# Patient Record
Sex: Female | Born: 1950
Health system: Southern US, Community
[De-identification: ages and names within clinical notes are randomized; demographics above are authoritative.]

## PROBLEM LIST (undated history)

## (undated) ENCOUNTER — Emergency Department (HOSPITAL_BASED_OUTPATIENT_CLINIC_OR_DEPARTMENT_OTHER): Admission: EM | Payer: BC Managed Care – PPO

## (undated) DIAGNOSIS — N959 Unspecified menopausal and perimenopausal disorder: Principal | ICD-10-CM

## (undated) DIAGNOSIS — K5790 Diverticulosis of intestine, part unspecified, without perforation or abscess without bleeding: Secondary | ICD-10-CM

## (undated) DIAGNOSIS — D649 Anemia, unspecified: Secondary | ICD-10-CM

## (undated) DIAGNOSIS — M949 Disorder of cartilage, unspecified: Secondary | ICD-10-CM

## (undated) DIAGNOSIS — T7840XA Allergy, unspecified, initial encounter: Secondary | ICD-10-CM

## (undated) DIAGNOSIS — N200 Calculus of kidney: Secondary | ICD-10-CM

## (undated) DIAGNOSIS — E785 Hyperlipidemia, unspecified: Secondary | ICD-10-CM

## (undated) DIAGNOSIS — K649 Unspecified hemorrhoids: Secondary | ICD-10-CM

## (undated) DIAGNOSIS — M899 Disorder of bone, unspecified: Secondary | ICD-10-CM

## (undated) HISTORY — PX: CYSTOSCOPY: SUR368

## (undated) HISTORY — PX: TONSILLECTOMY: SHX5217

## (undated) HISTORY — DX: Unspecified hemorrhoids: K64.9

## (undated) HISTORY — DX: Disorder of cartilage, unspecified: M94.9

## (undated) HISTORY — PX: POLYPECTOMY: SHX149

## (undated) HISTORY — DX: Disorder of bone, unspecified: M89.9

## (undated) HISTORY — DX: Anemia, unspecified: D64.9

## (undated) HISTORY — DX: Diverticulosis of intestine, part unspecified, without perforation or abscess without bleeding: K57.90

## (undated) HISTORY — DX: Hyperlipidemia, unspecified: E78.5

## (undated) HISTORY — DX: Allergy, unspecified, initial encounter: T78.40XA

## (undated) HISTORY — PX: COLONOSCOPY: SHX174

## (undated) HISTORY — DX: Unspecified menopausal and perimenopausal disorder: N95.9

---

## 1998-04-19 HISTORY — PX: FRACTURE SURGERY: SHX138

## 1998-04-28 ENCOUNTER — Encounter: Payer: Self-pay | Admitting: Family Medicine

## 1998-04-28 ENCOUNTER — Ambulatory Visit (HOSPITAL_COMMUNITY): Admission: RE | Admit: 1998-04-28 | Discharge: 1998-04-28 | Payer: Self-pay | Admitting: Family Medicine

## 1998-04-28 ENCOUNTER — Emergency Department (HOSPITAL_COMMUNITY): Admission: EM | Admit: 1998-04-28 | Discharge: 1998-04-28 | Payer: Self-pay | Admitting: Emergency Medicine

## 1998-04-30 ENCOUNTER — Encounter: Payer: Self-pay | Admitting: Urology

## 1998-04-30 ENCOUNTER — Inpatient Hospital Stay (HOSPITAL_COMMUNITY): Admission: EM | Admit: 1998-04-30 | Discharge: 1998-05-01 | Payer: Self-pay | Admitting: Urology

## 1998-05-01 ENCOUNTER — Encounter: Payer: Self-pay | Admitting: Urology

## 1999-02-24 ENCOUNTER — Other Ambulatory Visit: Admission: RE | Admit: 1999-02-24 | Discharge: 1999-02-24 | Payer: Self-pay | Admitting: Family Medicine

## 2000-11-24 ENCOUNTER — Other Ambulatory Visit: Admission: RE | Admit: 2000-11-24 | Discharge: 2000-11-24 | Payer: Self-pay | Admitting: Family Medicine

## 2001-04-03 ENCOUNTER — Ambulatory Visit (HOSPITAL_COMMUNITY): Admission: RE | Admit: 2001-04-03 | Discharge: 2001-04-03 | Payer: Self-pay | Admitting: Gastroenterology

## 2002-01-16 ENCOUNTER — Ambulatory Visit (HOSPITAL_COMMUNITY): Admission: RE | Admit: 2002-01-16 | Discharge: 2002-01-16 | Payer: Self-pay | Admitting: Orthopedic Surgery

## 2003-03-20 ENCOUNTER — Encounter: Admission: RE | Admit: 2003-03-20 | Discharge: 2003-03-20 | Payer: Self-pay | Admitting: Family Medicine

## 2003-08-22 ENCOUNTER — Other Ambulatory Visit: Admission: RE | Admit: 2003-08-22 | Discharge: 2003-08-22 | Payer: Self-pay | Admitting: Family Medicine

## 2004-02-27 ENCOUNTER — Ambulatory Visit: Payer: Self-pay | Admitting: Family Medicine

## 2004-04-02 ENCOUNTER — Ambulatory Visit: Payer: Self-pay | Admitting: Family Medicine

## 2004-09-15 ENCOUNTER — Ambulatory Visit: Payer: Self-pay | Admitting: Family Medicine

## 2004-09-24 ENCOUNTER — Other Ambulatory Visit: Admission: RE | Admit: 2004-09-24 | Discharge: 2004-09-24 | Payer: Self-pay | Admitting: Family Medicine

## 2004-09-24 ENCOUNTER — Ambulatory Visit: Payer: Self-pay | Admitting: Family Medicine

## 2004-11-25 ENCOUNTER — Ambulatory Visit: Payer: Self-pay | Admitting: Family Medicine

## 2005-03-04 ENCOUNTER — Ambulatory Visit: Payer: Self-pay | Admitting: Family Medicine

## 2005-03-09 ENCOUNTER — Ambulatory Visit: Payer: Self-pay | Admitting: Family Medicine

## 2005-06-01 ENCOUNTER — Ambulatory Visit: Payer: Self-pay | Admitting: Family Medicine

## 2005-09-28 ENCOUNTER — Ambulatory Visit: Payer: Self-pay | Admitting: Family Medicine

## 2005-10-19 ENCOUNTER — Ambulatory Visit: Payer: Self-pay | Admitting: Family Medicine

## 2005-10-26 ENCOUNTER — Ambulatory Visit: Payer: Self-pay | Admitting: Family Medicine

## 2005-10-26 ENCOUNTER — Other Ambulatory Visit: Admission: RE | Admit: 2005-10-26 | Discharge: 2005-10-26 | Payer: Self-pay | Admitting: Family Medicine

## 2005-10-26 ENCOUNTER — Encounter: Payer: Self-pay | Admitting: Family Medicine

## 2005-10-27 ENCOUNTER — Encounter: Payer: Self-pay | Admitting: Family Medicine

## 2005-10-27 LAB — CONVERTED CEMR LAB

## 2006-01-20 ENCOUNTER — Encounter: Admission: RE | Admit: 2006-01-20 | Discharge: 2006-01-20 | Payer: Self-pay | Admitting: Family Medicine

## 2006-03-02 ENCOUNTER — Ambulatory Visit: Payer: Self-pay | Admitting: Family Medicine

## 2006-04-11 ENCOUNTER — Ambulatory Visit: Payer: Self-pay | Admitting: Family Medicine

## 2006-05-19 LAB — HM COLONOSCOPY

## 2006-06-15 ENCOUNTER — Ambulatory Visit: Payer: Self-pay | Admitting: Family Medicine

## 2006-10-26 ENCOUNTER — Ambulatory Visit: Payer: Self-pay | Admitting: Family Medicine

## 2006-10-26 LAB — CONVERTED CEMR LAB
ALT: 24 units/L (ref 0–35)
Albumin: 3.9 g/dL (ref 3.5–5.2)
Alkaline Phosphatase: 69 units/L (ref 39–117)
Basophils Relative: 0 % (ref 0.0–1.0)
Bilirubin, Direct: 0.1 mg/dL (ref 0.0–0.3)
Eosinophils Relative: 2.4 % (ref 0.0–5.0)
HCT: 39.2 % (ref 36.0–46.0)
HDL: 54.3 mg/dL (ref >39.0)
Hemoglobin: 13.6 g/dL (ref 12.0–15.0)
Lymphocytes Relative: 36 % (ref 12.0–46.0)
MCHC: 34.5 g/dL (ref 30.0–36.0)
MCV: 91.8 fL (ref 78.0–100.0)
Monocytes Relative: 12.4 % — ABNORMAL HIGH (ref 3.0–11.0)
Neutrophils Relative %: 49.2 % (ref 43.0–77.0)
Platelets: 299 10*3/uL (ref 150–400)
Potassium: 3.8 meq/L (ref 3.5–5.1)
TSH: 3.46 microintl units/mL (ref 0.35–5.50)
Total Bilirubin: 0.8 mg/dL (ref 0.3–1.2)
Total CHOL/HDL Ratio: 4.2
Total Protein: 7.4 g/dL (ref 6.0–8.3)
Triglycerides: 148 mg/dL (ref 0–149)

## 2006-11-02 ENCOUNTER — Other Ambulatory Visit: Admission: RE | Admit: 2006-11-02 | Discharge: 2006-11-02 | Payer: Self-pay | Admitting: Family Medicine

## 2006-11-02 ENCOUNTER — Ambulatory Visit: Payer: Self-pay | Admitting: Family Medicine

## 2006-11-02 ENCOUNTER — Encounter: Payer: Self-pay | Admitting: Family Medicine

## 2006-11-07 ENCOUNTER — Encounter: Payer: Self-pay | Admitting: Family Medicine

## 2006-11-10 ENCOUNTER — Telehealth: Payer: Self-pay | Admitting: Family Medicine

## 2006-11-16 ENCOUNTER — Telehealth: Payer: Self-pay | Admitting: Family Medicine

## 2006-12-06 ENCOUNTER — Telehealth: Payer: Self-pay | Admitting: Family Medicine

## 2007-02-23 ENCOUNTER — Telehealth: Payer: Self-pay | Admitting: Family Medicine

## 2007-03-15 ENCOUNTER — Encounter: Payer: Self-pay | Admitting: Family Medicine

## 2007-03-15 ENCOUNTER — Encounter: Admission: RE | Admit: 2007-03-15 | Discharge: 2007-03-15 | Payer: Self-pay | Admitting: Family Medicine

## 2007-05-02 ENCOUNTER — Encounter: Payer: Self-pay | Admitting: Family Medicine

## 2007-11-30 ENCOUNTER — Ambulatory Visit: Payer: Self-pay | Admitting: Family Medicine

## 2007-11-30 LAB — CONVERTED CEMR LAB
Bilirubin Urine: NEGATIVE
Glucose, Urine, Semiquant: NEGATIVE
Ketones, urine, test strip: NEGATIVE
Protein, U semiquant: NEGATIVE
Specific Gravity, Urine: 1.02

## 2007-12-07 ENCOUNTER — Ambulatory Visit: Payer: Self-pay | Admitting: Family Medicine

## 2007-12-07 ENCOUNTER — Other Ambulatory Visit: Admission: RE | Admit: 2007-12-07 | Discharge: 2007-12-07 | Payer: Self-pay | Admitting: Family Medicine

## 2007-12-07 ENCOUNTER — Encounter: Payer: Self-pay | Admitting: Family Medicine

## 2007-12-07 DIAGNOSIS — E785 Hyperlipidemia, unspecified: Secondary | ICD-10-CM | POA: Insufficient documentation

## 2007-12-07 DIAGNOSIS — M899 Disorder of bone, unspecified: Secondary | ICD-10-CM

## 2007-12-07 DIAGNOSIS — M949 Disorder of cartilage, unspecified: Secondary | ICD-10-CM

## 2007-12-07 HISTORY — DX: Disorder of bone, unspecified: M89.9

## 2007-12-07 HISTORY — DX: Hyperlipidemia, unspecified: E78.5

## 2007-12-07 LAB — CONVERTED CEMR LAB
BUN: 15 mg/dL (ref 6–23)
Bilirubin, Direct: 0.1 mg/dL (ref 0.0–0.3)
CO2: 29 meq/L (ref 19–32)
Calcium: 9 mg/dL (ref 8.4–10.5)
Chloride: 109 meq/L (ref 96–112)
Cholesterol: 225 mg/dL (ref 0–200)
Direct LDL: 147.6 mg/dL
Eosinophils Absolute: 0.1 10*3/uL (ref 0.0–0.7)
GFR calc non Af Amer: 79 mL/min
Glucose, Bld: 88 mg/dL (ref 70–99)
HCT: 37.9 % (ref 36.0–46.0)
Lymphocytes Relative: 29.3 % (ref 12.0–46.0)
MCHC: 34.6 g/dL (ref 30.0–36.0)
MCV: 92 fL (ref 78.0–100.0)
Monocytes Relative: 10.8 % (ref 3.0–12.0)
Neutro Abs: 3.2 10*3/uL (ref 1.4–7.7)
Neutrophils Relative %: 57.1 % (ref 43.0–77.0)
RDW: 11.6 % (ref 11.5–14.6)
Sodium: 142 meq/L (ref 135–145)
TSH: 2.34 microintl units/mL (ref 0.35–5.50)
Total Bilirubin: 0.7 mg/dL (ref 0.3–1.2)
Total CHOL/HDL Ratio: 4.7
Triglycerides: 135 mg/dL (ref 0–149)

## 2007-12-14 LAB — CONVERTED CEMR LAB: Vit D, 1,25-Dihydroxy: 15 — ABNORMAL LOW (ref 30–89)

## 2007-12-15 LAB — CONVERTED CEMR LAB: Pap Smear: NORMAL

## 2008-03-28 ENCOUNTER — Encounter: Admission: RE | Admit: 2008-03-28 | Discharge: 2008-03-28 | Payer: Self-pay | Admitting: Family Medicine

## 2008-06-05 ENCOUNTER — Telehealth: Payer: Self-pay | Admitting: Family Medicine

## 2008-12-02 ENCOUNTER — Ambulatory Visit: Payer: Self-pay | Admitting: Family Medicine

## 2008-12-10 ENCOUNTER — Encounter: Payer: Self-pay | Admitting: Family Medicine

## 2008-12-10 ENCOUNTER — Other Ambulatory Visit: Admission: RE | Admit: 2008-12-10 | Discharge: 2008-12-10 | Payer: Self-pay | Admitting: Family Medicine

## 2008-12-10 ENCOUNTER — Ambulatory Visit: Payer: Self-pay | Admitting: Family Medicine

## 2008-12-10 DIAGNOSIS — N959 Unspecified menopausal and perimenopausal disorder: Secondary | ICD-10-CM

## 2008-12-10 HISTORY — DX: Unspecified menopausal and perimenopausal disorder: N95.9

## 2008-12-19 ENCOUNTER — Encounter: Payer: Self-pay | Admitting: Family Medicine

## 2008-12-19 LAB — CONVERTED CEMR LAB
AST: 24 units/L (ref 0–37)
Albumin: 3.9 g/dL (ref 3.5–5.2)
Alkaline Phosphatase: 67 units/L (ref 39–117)
Basophils Relative: 0 % (ref 0.0–3.0)
Bilirubin Urine: NEGATIVE
Bilirubin, Direct: 0.1 mg/dL (ref 0.0–0.3)
Calcium: 9.1 mg/dL (ref 8.4–10.5)
Cholesterol: 231 mg/dL — ABNORMAL HIGH (ref 0–200)
GFR calc non Af Amer: 78.58 mL/min (ref >60)
HCT: 39.5 % (ref 36.0–46.0)
Hemoglobin: 13.7 g/dL (ref 12.0–15.0)
Ketones, ur: NEGATIVE mg/dL
MCHC: 34.7 g/dL (ref 30.0–36.0)
MCV: 91.1 fL (ref 78.0–100.0)
Neutro Abs: 1.6 10*3/uL (ref 1.4–7.7)
Neutrophils Relative %: 39.1 % — ABNORMAL LOW (ref 43.0–77.0)
RBC: 4.33 M/uL (ref 3.87–5.11)
Sodium: 143 meq/L (ref 135–145)
TSH: 4.22 microintl units/mL (ref 0.35–5.50)
Total CHOL/HDL Ratio: 5
Total Protein, Urine: NEGATIVE mg/dL
Total Protein: 7.2 g/dL (ref 6.0–8.3)
Urine Glucose: NEGATIVE mg/dL
Urobilinogen, UA: 0.2 (ref 0.0–1.0)
VLDL: 27.4 mg/dL (ref 0.0–40.0)

## 2009-04-07 ENCOUNTER — Encounter: Admission: RE | Admit: 2009-04-07 | Discharge: 2009-04-07 | Payer: Self-pay | Admitting: Family Medicine

## 2009-06-26 ENCOUNTER — Telehealth: Payer: Self-pay | Admitting: Family Medicine

## 2009-09-25 ENCOUNTER — Ambulatory Visit: Payer: Self-pay | Admitting: Family Medicine

## 2009-09-25 DIAGNOSIS — D649 Anemia, unspecified: Secondary | ICD-10-CM | POA: Insufficient documentation

## 2009-09-25 DIAGNOSIS — R197 Diarrhea, unspecified: Secondary | ICD-10-CM | POA: Insufficient documentation

## 2009-09-25 HISTORY — DX: Anemia, unspecified: D64.9

## 2009-10-06 LAB — CONVERTED CEMR LAB: Chloride: 107 meq/L (ref 96–112)

## 2009-11-26 ENCOUNTER — Telehealth: Payer: Self-pay | Admitting: Family Medicine

## 2010-02-19 ENCOUNTER — Ambulatory Visit: Payer: Self-pay | Admitting: Family Medicine

## 2010-02-24 LAB — CONVERTED CEMR LAB
AST: 26 units/L (ref 0–37)
Alkaline Phosphatase: 79 units/L (ref 39–117)
Basophils Absolute: 0.1 10*3/uL (ref 0.0–0.1)
Basophils Relative: 0.8 % (ref 0.0–3.0)
Bilirubin, Direct: 0.1 mg/dL (ref 0.0–0.3)
Calcium: 9.2 mg/dL (ref 8.4–10.5)
Chloride: 103 meq/L (ref 96–112)
Cholesterol: 249 mg/dL — ABNORMAL HIGH (ref 0–200)
Creatinine, Ser: 0.9 mg/dL (ref 0.4–1.2)
Direct LDL: 162 mg/dL
Eosinophils Absolute: 0.1 10*3/uL (ref 0.0–0.7)
Eosinophils Relative: 1.4 % (ref 0.0–5.0)
GFR calc non Af Amer: 71.98 mL/min (ref >60)
Glucose, Bld: 80 mg/dL (ref 70–99)
Hemoglobin: 13.3 g/dL (ref 12.0–15.0)
Lymphocytes Relative: 34.1 % (ref 12.0–46.0)
MCHC: 34.5 g/dL (ref 30.0–36.0)
MCV: 92.6 fL (ref 78.0–100.0)
Neutro Abs: 3.4 10*3/uL (ref 1.4–7.7)
Potassium: 3.9 meq/L (ref 3.5–5.1)
Total Bilirubin: 0.7 mg/dL (ref 0.3–1.2)
Triglycerides: 108 mg/dL (ref 0.0–149.0)
VLDL: 21.6 mg/dL (ref 0.0–40.0)
WBC: 6.3 10*3/uL (ref 4.5–10.5)

## 2010-04-02 ENCOUNTER — Encounter: Payer: Self-pay | Admitting: Family Medicine

## 2010-04-02 ENCOUNTER — Other Ambulatory Visit
Admission: RE | Admit: 2010-04-02 | Discharge: 2010-04-02 | Payer: Self-pay | Source: Home / Self Care | Admitting: Family Medicine

## 2010-04-02 ENCOUNTER — Ambulatory Visit: Payer: Self-pay | Admitting: Family Medicine

## 2010-04-02 LAB — CONVERTED CEMR LAB
Bilirubin Urine: NEGATIVE
Nitrite: NEGATIVE
WBC Urine, dipstick: NEGATIVE
pH: 6

## 2010-04-02 LAB — HM PAP SMEAR: HM Pap smear: NORMAL

## 2010-04-07 ENCOUNTER — Telehealth: Payer: Self-pay | Admitting: Family Medicine

## 2010-04-08 ENCOUNTER — Encounter
Admission: RE | Admit: 2010-04-08 | Discharge: 2010-04-08 | Payer: Self-pay | Source: Home / Self Care | Attending: Family Medicine | Admitting: Family Medicine

## 2010-04-08 LAB — HM MAMMOGRAPHY: HM Mammogram: NEGATIVE

## 2010-04-09 ENCOUNTER — Telehealth: Payer: Self-pay | Admitting: Family Medicine

## 2010-05-21 NOTE — Progress Notes (Signed)
Summary: REQ FOR RETURN CALL  Phone Note Call from Patient   Caller: Patient   [phone] Call For: Judithann Sheen MD Summary of Call: Pt called to adv that she needs to speak with Dr Scotty Court or Almira Coaster, RN .Marland Kitchen.. Pt would not elaborate as to the reason why?  Pt can be reached at [phone].  Initial call taken by: Debbra Riding,  November 26, 2009 8:13 AM  Follow-up for Phone Call        spoke with pt.  c/o coughing  yellow nasal discharge denies fever no chills requesting z pack to [facility] Follow-up by: Pura Spice, RN,  November 26, 2009 8:23 AM  Additional Follow-up for Phone Call Additional follow up Details #1::        per dr Scotty Court call in z pk and if no better ov.  pt aware.  Additional Follow-up by: Pura Spice, RN,  November 26, 2009 8:23 AM    New/Updated Medications: AZITHROMYCIN 250 MG  TABS (AZITHROMYCIN) 2 by  mouth today and then 1 daily for 4 days Prescriptions: AZITHROMYCIN 250 MG  TABS (AZITHROMYCIN) 2 by  mouth today and then 1 daily for 4 days  #6 x 0   Entered by:   Pura Spice, RN   Authorized by:   Judithann Sheen MD   Signed by:   Pura Spice, RN on 11/26/2009   Method used:   Electronically to        [facility] [phone]* (retail)       [address]       [place], [place]  [address]       Ph: [phone]       Fax: [phone]   RxID:   [id number]

## 2010-05-21 NOTE — Progress Notes (Signed)
Summary: return call  Phone Note Call from Patient Call back at [facility] Phone [phone] Call back at Work Phone [phone]   Summary of Call: Please return pt's call.  She says it is VERY important she speak to him as he called her. Initial call taken by: Lynann Beaver CMA AAMA,  April 09, 2010 2:17 PM  Follow-up for Phone Call        Pt would like for Dr Scotty Court to call her back as soon as he gets through seeing pts today. Pt says it is very important and that she is returning Dr Laurita Quint call from earlier. Follow-up by: Lucy Antigua,  April 09, 2010 2:51 PM  Additional Follow-up for Phone Call Additional follow up Details #1::        please decline ov Additional Follow-up by: Heron Sabins,  April 09, 2010 3:05 PM    Additional Follow-up for Phone Call Additional follow up Details #2::    Dr. Scotty Court to call pt Follow-up by: Trixie Dredge,  April 09, 2010 4:07 PM

## 2010-05-21 NOTE — Progress Notes (Signed)
Summary: URI  Phone Note Call from Patient Call back at Home Phone [phone]   Caller: Patient Call For: Judithann Sheen MD Summary of Call: Pt is in [place], and says Dr. Scotty Court wanted her to call him whenever she was sick and he would call her back.  She is complaining of sore throat, ear ache, and cough. Her number is :  [phone] pharmacy Initial call taken by: Lynann Beaver CMA,  June 26, 2009 10:06 AM  Follow-up for Phone Call        have called left message with patient

## 2010-05-21 NOTE — Assessment & Plan Note (Signed)
Summary: cpx--pap//ccm/pt rescd from bump//ccm   Vital Signs:  Patient profile:   60 year old female Menstrual status:  postmenopausal Height:      65 inches Weight:      154 pounds BMI:     25.72 O2 Sat:      97 % on Room air Temp:     98.8 degrees F oral Pulse rate:   97 / minute Pulse rhythm:   regular BP sitting:   110 / 60  (left arm) Cuff size:   regular  Vitals Entered By: Romualdo Bolk, CMA (AAMA) (April 02, 2010 2:30 PM)  O2 Flow:  Room air  CC: CPX with pap   History of Present Illness: this 60 year old white divorced female mother of 2 grandmother a one granddaughter is in for a complete physical examination Postmenopausal Next colonoscope examination is due 2018. Sternal cardiovascular history with mother having had a CVA and her father had 6 bypass with CAD. Patient has hyperlipidemia and recommend Lipitor 20 mg q.d. Patient is chronic history of tension as well as her Caval episodes of diarrhea associated with stress As frequent URIs or pharyngitis which is thought to be secondary to exposure by her granddaughter  Preventive Screening-Counseling & Management  Alcohol-Tobacco     Smoking Status: never  Current Medications (verified): 1)  Aspirin Ec 81 Mg Tbec (Aspirin) .... Take 1 Tablet By Mouth Every Morning 2)  Caltrate 600+d Plus 600-400 Mg-Unit  Chew (Calcium Carbonate-Vit D-Min) .... Once Daily 3)  Fish Oil   Oil (Fish Oil) .... Once Daily 4)  Vitamin D 98119 Unit Caps (Ergocalciferol) .Marland Kitchen.. 1  By Mouth Every Week  Allergies (verified): 1)  Codeine Phosphate (Codeine Phosphate)  Past History:  Past Surgical History: Last updated: 11/02/2006 Tonsillectomy  Risk Factors: Smoking Status: never (04/02/2010)  Past Medical History: Kidney Calculus fracture left ring finger  Review of Systems      See HPI General:  Denies chills, fatigue, fever, loss of appetite, malaise, sleep disorder, sweats, weakness, and weight loss. Eyes:   Denies blurring, discharge, double vision, eye irritation, eye pain, halos, itching, light sensitivity, red eye, vision loss-1 eye, and vision loss-both eyes. ENT:  Complains of sore throat; frequent sore throats orURIs. CV:  Denies bluish discoloration of lips or nails, chest pain or discomfort, difficulty breathing at night, difficulty breathing while lying down, fainting, fatigue, leg cramps with exertion, lightheadness, near fainting, palpitations, shortness of breath with exertion, swelling of feet, swelling of hands, and weight gain. Resp:  Denies chest discomfort, chest pain with inspiration, cough, coughing up blood, excessive snoring, hypersomnolence, morning headaches, pleuritic, shortness of breath, sputum productive, and wheezing. GI:  See HPI; Complains of diarrhea. GU:  See HPI; Denies abnormal vaginal bleeding, decreased libido, discharge, dysuria, genital sores, hematuria, incontinence, nocturia, urinary frequency, and urinary hesitancy. MS:  Denies joint pain, joint redness, joint swelling, loss of strength, low back pain, mid back pain, muscle aches, muscle , cramps, muscle weakness, stiffness, and thoracic pain. Derm:  Denies changes in color of skin, changes in nail beds, dryness, excessive perspiration, flushing, hair loss, insect bite(s), itching, lesion(s), poor wound healing, and rash. Neuro:  Denies brief paralysis, difficulty with concentration, disturbances in coordination, falling down, headaches, inability to speak, memory loss, numbness, poor balance, seizures, sensation of room spinning, tingling, tremors, visual disturbances, and weakness. Psych:  See HPI; Complains of anxiety.  Physical Exam  General:  Well-developed,well-nourished,in no acute distress; alert,appropriate and cooperative throughout examination Head:  Normocephalic and  atraumatic without obvious abnormalities. No apparent alopecia or balding. Eyes:  No corneal or conjunctival inflammation noted. EOMI.  Perrla. Funduscopic exam benign, without hemorrhages, exudates or papilledema. Vision grossly normal. Ears:  External ear exam shows no significant lesions or deformities.  Otoscopic examination reveals clear canals, tympanic membranes are intact bilaterally without bulging, retraction, inflammation or discharge. Hearing is grossly normal bilaterally. Nose:  External nasal examination shows no deformity or inflammation. Nasal mucosa are pink and moist without lesions or exudates. Mouth:  Oral mucosa and oropharynx without lesions or exudates.  Teeth in good repair. Neck:  No deformities, masses, or tenderness noted. Chest Wall:  No deformities, masses, or tenderness noted. Breasts:  No mass, nodules, thickening, tenderness, bulging, retraction, inflamation, nipple discharge or skin changes noted.   Lungs:  Normal respiratory effort, chest expands symmetrically. Lungs are clear to auscultation, no crackles or wheezes. Heart:  Normal rate and regular rhythm. S1 and S2 normal without gallop, murmur, click, rub or other extra sounds. Abdomen:  Bowel sounds positive,abdomen soft and non-tender without masses, organomegaly or hernias noted. Rectal:  No external abnormalities noted. Normal sphincter tone. No rectal masses or tenderness. Genitalia:  Normal introitus for age, no external lesions, no vaginal discharge, mucosa pink and moist, no vaginal or cervical lesions, no vaginal atrophy, no friaility or hemorrhage, normal uterus size and position, no adnexal masses or tenderness Msk:  No deformity or scoliosis noted of thoracic or lumbar spine.   Pulses:  R and L carotid,radial,femoral,dorsalis pedis and posterior tibial pulses are full and equal bilaterally Extremities:  No clubbing, cyanosis, edema, or deformity noted with normal full range of motion of all joints.   Neurologic:  No cranial nerve deficits noted. Station and gait are normal. Plantar reflexes are down-going bilaterally. DTRs are  symmetrical throughout. Sensory, motor and coordinative functions appear intact. Skin:  Intact without suspicious lesions or rashes Cervical Nodes:  No lymphadenopathy noted Axillary Nodes:  No palpable lymphadenopathy Inguinal Nodes:  No significant adenopathy Psych:  Cognition and judgment appear intact. Alert and cooperative with normal attention span and concentration. No apparent delusions, illusions, hallucinations   Problems:  Medical Problems Added: 1)  Dx of Physical Examination  (ICD-V70.0) 2)  Dx of Hyperlipidemia  (ICD-272.4)  Impression & Recommendations:  Problem # 1:  PHYSICAL EXAMINATION (ICD-V70.0) Assessment New  Problem # 2:  HYPERLIPIDEMIA (ICD-272.4) Assessment: Deteriorated  The following medications were removed from the medication list:    Lipitor 20 Mg Tabs (Atorvastatin calcium) .Marland Kitchen... 1 once daily for hyperlipidemia Her updated medication list for this problem includes:    Pravachol 40 Mg Tabs (Pravastatin sodium) .Marland Kitchen..Marland Kitchen1 once daily recheck lipids in 3 months  Problem # 3:  DIARRHEA (ICD-787.91) Assessment: Unchanged  Problem # 4:  PERIMENOPAUSAL SYNDROME (ICD-627.9) Assessment: Unchanged  Complete Medication List: 1)  Aspirin Ec 81 Mg Tbec (Aspirin) .... Take 1 tablet by mouth every morning 2)  Caltrate 600+d Plus 600-400 Mg-unit Chew (Calcium carbonate-vit d-min) .... Once daily 3)  Fish Oil Oil (Fish oil) .... Once daily 4)  Vitamin D 16109 Unit Caps (Ergocalciferol) .Marland Kitchen.. 1  by mouth every week 5)  Pravachol 40 Mg Tabs (Pravastatin sodium) .Marland Kitchen.. 1  once daily to decrease lipids 6)  Zithromax Z-pak 250 Mg Tabs (Azithromycin) .... 2 stat then 1 per day  Other Orders: Admin 1st Vaccine (60454) Flu Vaccine 68yrs + [phone])  Patient Instructions: 1)  doing fine 2)  elevated cholesterol and sttrong family history ofarteriochrosis 3)  to startpravastatin 40  mg instead of lipitor due to cost 4)  return 3 months for lipids and hepatic function,  schedule appt Prescriptions: ZITHROMAX Z-PAK 250 MG TABS (AZITHROMYCIN) 2 stat then 1 per day  #1 pkge x 0   Entered and Authorized by:   Judithann Sheen MD   Signed by:   Judithann Sheen MD on 04/10/2010   Method used:   Electronically to        [id number]* (retail)       [address]       [place], [place]  [address]       Ph: [phone]       Fax: [phone]   RxID:   [id number] PRAVACHOL 40 MG TABS (PRAVASTATIN SODIUM) 1  once daily to decrease lipids  #39 x 11   Entered and Authorized by:   Judithann Sheen MD   Signed by:   Judithann Sheen MD on 04/10/2010   Method used:   Electronically to        [facility] #[id number]* (retail)       [address]       [place], [place]  [address]       Ph: [phone]       Fax: [phone]   RxID:   [id number] LIPITOR 20 MG TABS (ATORVASTATIN CALCIUM) 1 once daily for hyperlipidemia  #30 x 11   Entered and Authorized by:   Judithann Sheen MD   Signed by:   Judithann Sheen MD on 04/02/2010   Method used:   Electronically to        [facility] [phone]* (retail)       [address]       [place], [place]  [address]       Ph: [phone]       Fax: [phone]   RxID:   [id number]    Orders Added: 1)  Admin 1st Vaccine [90471] 2)  Flu Vaccine 50yrs + [48546] 3)  New Patient 40-64 years [99386] Flu Vaccine Consent Questions     Do you have a history of severe allergic reactions to this vaccine? no    Any prior history of allergic reactions to egg and/or gelatin? no    Do you have a sensitivity to the preservative Thimersol? no    Do you have a past history of Guillan-Barre Syndrome? no    Do you currently have an acute febrile illness? no    Have you ever had a severe reaction to latex? no    Vaccine information given and explained to patient? yes    Are you currently pregnant? no    Lot  Number:[id number]   Exp Date:10/17/2010   Site Given  Left Deltoid IM Romualdo Bolk, CMA Duncan Dull)  April 02, 2010 4:32 PM   Vaccine 35yrs + [phone]       .lbflu

## 2010-05-21 NOTE — Assessment & Plan Note (Signed)
Summary: ? virus//ccm   Vital Signs:  Patient profile:   60 year old female Menstrual status:  postmenopausal Weight:      153 pounds O2 Sat:      98 % Temp:     98.5 degrees F Pulse rate:   91 / minute Pulse rhythm:   regular BP sitting:   120 / 82  (left arm)  Vitals Entered By: Pura Spice, RN (September 25, 2009 3:14 PM) CC: had diarrhea 2 days last week. been normal with bowels since sunday. today , slight dizzy     History of Present Illness: This 60 year old white divorced female has had diarrhea for the past 2-3 days and is now subsiding. No bleeding no nausea vomiting uncertain about fever he has had some dizziness over the past 3 days and weakness but is improved, concerned about electrolyte loss No other complaint  Allergies: 1)  Codeine Phosphate (Codeine Phosphate)  Past History:  Past Surgical History: Last updated: 11/02/2006 Tonsillectomy  Risk Factors: Smoking Status: never (11/02/2006)  Review of Systems  The patient denies anorexia, fever, weight loss, weight gain, vision loss, decreased hearing, hoarseness, chest pain, syncope, dyspnea on exertion, peripheral edema, prolonged cough, headaches, hemoptysis, abdominal pain, melena, hematochezia, severe indigestion/heartburn, hematuria, incontinence, genital sores, muscle weakness, suspicious skin lesions, transient blindness, difficulty walking, depression, unusual weight change, abnormal bleeding, enlarged lymph nodes, angioedema, breast masses, and testicular masses.    Physical Exam  General:  Well-developed,well-nourished,in no acute distress; alert,appropriate and cooperative throughout examination Head:  Normocephalic and atraumatic without obvious abnormalities. No apparent alopecia or balding. Eyes:  No corneal or conjunctival inflammation noted. EOMI. Perrla. Funduscopic exam benign, without hemorrhages, exudates or papilledema. Vision grossly normal. Ears:  External ear exam shows no significant  lesions or deformities.  Otoscopic examination reveals clear canals, tympanic membranes are intact bilaterally without bulging, retraction, inflammation or discharge. Hearing is grossly normal bilaterally. Nose:  External nasal examination shows no deformity or inflammation. Nasal mucosa are pink and moist without lesions or exudates. Mouth:  Oral mucosa and oropharynx without lesions or exudates.  Teeth in good repair. Lungs:  Normal respiratory effort, chest expands symmetrically. Lungs are clear to auscultation, no crackles or wheezes. Heart:  Normal rate and regular rhythm. S1 and S2 normal without gallop, murmur, click, rub or other extra sounds. Abdomen:  Bowel sounds positive,abdomen soft and non-tender without masses, organomegaly or hernias noted.   Impression & Recommendations:  Problem # 1:  VIRAL GASTROENTERITIS (ICD-008.8) Assessment Improved  Problem # 2:  DIARRHEA (ICD-787.91) Assessment: Improved  Orders: Venipuncture ([address]) Fingerstick (60454) TLB-Electrolyte Panel (NA/K/CL/CO2) (80051-LYTES)  Complete Medication List: 1)  Aspirin Ec 81 Mg Tbec (Aspirin) .... Take 1 tablet by mouth every morning 2)  Caltrate 600+d Plus 600-400 Mg-unit Chew (Calcium carbonate-vit d-min) .... Once daily 3)  Fish Oil Oil (Fish oil) .... Once daily 4)  Aciphex 20 Mg Tbec (Rabeprazole sodium) .Marland Kitchen.. 1 qd 5)  Vitamin D 09811 Unit Caps (Ergocalciferol) .Marland Kitchen.. 1  by mouth every week  Other Orders: Hgb (91478)  Patient Instructions: 1)  diagnosis of viral enteritis, recommend that you increase fluid intake including things as Gatorade 2)  We'll check electrolytes and hemoglobin

## 2010-05-21 NOTE — Progress Notes (Signed)
Summary: RETURNING PHONE CALL / QUESTION CONCERNING MED  Phone Note Call from Patient   Caller: Patient   [phone] Summary of Call: Pt called to adv that she was returning Dr Laurita Quint phone call...Marland Kitchen. states that she has questions regarding Rx (Lipitor) she was prescribed at time of OV...... Pt can be reached at  [phone] (until 5pm)   or cell # [phone].  Initial call taken by: Debbra Riding,  April 07, 2010 8:23 AM  Follow-up for Phone Call        returning Dr Charmian Muff call. call [phone]. Follow-up by: Warnell Forester,  April 07, 2010 12:46 PM  Additional Follow-up for Phone Call Additional follow up Details #1::        Pt called back to see if Dr. Scotty Court could pls call her today. [phone] cell  Additional Follow-up by: Lucy Antigua,  April 07, 2010 1:51 PM    Additional Follow-up for Phone Call Additional follow up Details #2::    PT CB, PT IS AWARE [facility] OUT OF OFFICE TODAY Follow-up by: Heron Sabins,  April 08, 2010 9:27 AM  Additional Follow-up for Phone Call Additional follow up Details #3:: Details for Additional Follow-up Action Taken: Pt called to ck on status of phone note.... requests a return call..... can be reached on her cell   # [phone]   or   office #  [phone]...Marland KitchenMarland Kitchen needs to discuss medication and is concerned she may have bronchitis.Marland KitchenMarland KitchenMarland KitchenPt was offered OV with another physician for eval of possible bronchitis (acute problem) but pt declined, wants to wait for Dr Scotty Court.  Pt called to ck on status of previous note from this morning.....  Debbra Riding  April 09, 2010 11:59 AM  Pt wants to speak with Dr Scotty Court.... adv she has a question regarding her meds... declined speaking with anyone else, adv she wants to speak with Dr Scotty Court.... Pt aware that Dr Scotty Court is out of office and won't return till Tuesday, 12/27.... Declined OV with any other physician..... Debbra Riding  April 10, 2010 2:15 PM   Additional  Follow-up by: Debbra Riding,  April 09, 2010 8:39 AM

## 2010-07-23 ENCOUNTER — Other Ambulatory Visit (INDEPENDENT_AMBULATORY_CARE_PROVIDER_SITE_OTHER): Payer: BC Managed Care – PPO | Admitting: Family Medicine

## 2010-07-23 DIAGNOSIS — E785 Hyperlipidemia, unspecified: Secondary | ICD-10-CM

## 2010-07-23 DIAGNOSIS — T887XXA Unspecified adverse effect of drug or medicament, initial encounter: Secondary | ICD-10-CM

## 2010-07-23 LAB — LIPID PANEL
HDL: 52.6 mg/dL (ref >39.00)
Total CHOL/HDL Ratio: 3
VLDL: 18.8 mg/dL (ref 0.0–40.0)

## 2010-07-23 LAB — HEPATIC FUNCTION PANEL
AST: 23 U/L (ref 0–37)
Albumin: 3.8 g/dL (ref 3.5–5.2)
Bilirubin, Direct: 0.1 mg/dL (ref 0.0–0.3)
Total Protein: 6.9 g/dL (ref 6.0–8.3)

## 2010-09-04 NOTE — Op Note (Signed)
NAMESANVI, EHLER                        ACCOUNT NO.:  [id number]   MEDICAL RECORD NO.:  [id number]                   PATIENT TYPE:  OIB   LOCATION:  2860                                 FACILITY:  [facility]   PHYSICIAN:  Dionne Ano. Everlene Other, M.D.         DATE OF BIRTH:  06-Feb-1951   DATE OF PROCEDURE:  01/16/2002  DATE OF DISCHARGE:  01/16/2002                                 OPERATIVE REPORT   PREOPERATIVE DIAGNOSIS:  Displaced, subacute left ring finger proximal  phalanx fracture.   POSTOPERATIVE DIAGNOSIS:  Displaced, subacute left ring finger proximal  phalanx fracture.   PROCEDURES:  1. Closed reduction and pinning with 0.045 and 0.035 K-wires, left ring     finger proximal phalanx fracture.  2. Stress radiography.   SURGEON:  Dionne Ano. Amanda Pea, M.D.   ASSISTANT:  None.   COMPLICATIONS:  None.   ANESTHESIA:  General LMA anesthetic.   ESTIMATED BLOOD LOSS:  Minimal.   TOURNIQUET TIME:  Zero.   INDICATIONS FOR THE PROCEDURE:  This patient is a very pleasant female who  presents with a subacute injury to her finger.  She unfortunately has  significant overlapping of the finger against the small finger.  The normal  splay of the finger has been lost and the nail bed unfortunately was not  lined up with the scaphoid tubercle as the other fingers do.  I pointed this  out to her in the office and discussed her options of conservative  management only and living with the deformity versus operative intervention.  She decided to proceed with operative intervention.  I discussed the risks  of bleeding, infection, anesthesia, damage to normal structures, failure of  surgery, __________, return of strong function.  With this she desired to  proceed.  All questions have been encouraged and answered preoperatively.   OPERATIVE PROCEDURE IN DETAIL:  The patient was seen by myself and  anesthesia and taken to the operative suite and underwent prophylactic  antibiotic  administration, was laid supine, underwent general LMA anesthetic  and then had a prep and drape applied about the left upper extremity.  Once  this was done, under sterile field, the patient had manipulation of the  finger.  The patient's fracture site was noted to be fairly mobile.  At this  point in time I then performed closed reduction.  The fracture was fairly  difficult to place in absolutely perfect alignment and interdigitation,  however with pin placement and reduction maneuver with manipulation, I was  able to accomplish restoration of her nail bed alignment.  Two K-wires, one  0.035 and one 0.045, were placed proximal and distal entering at the  proximal phalanx at the MCP joint.  Care was taken to leave the MCP joint  free from pin penetration in terms of binding this joint.  The pins were  allowed to engage the proximal fragment and following this the 0.045 speared  the most proximal  portion of the distal end and then just under the volar  aspect, the 0.035 K-wire went down the medullary shaft.  I was pleased with  this overall given her nail bed alignment and finger splay.  I had achieved  correction of the alignment of the nail bed at the scaphoid tuberosity and  the normal finger splay.  There was no malrotation or other problems noted  intraoperatively.  Thus at this time Bactroban and Xeroform applied.  Final  copy x-rays were made.  The patient had had Marcaine without epinephrine  placed in the hand for an intermetacarpal block.  She tolerated this well,  was placed in a short plaster splint after careful padding.  She tolerated  the procedure well.  There were no complications.  She will be discharged  home after observation.  Now on appropriate pain management and muscle  relaxers.  I am giving her an additional prescription for Keflex given the  protruding pins.  I will see her back in the office in seven days and  proceed accordingly.  I have discussed all findings  with the family.  I did  note satisfaction with the restoration of her alignment compared to her  preoperative examination.  This was the main goal of the surgery of course.  All questions had been encouraged and answered.                                               Dionne Ano. Everlene Other, M.D.    Megan Lutz  D:  01/16/2002  T:  01/17/2002  Job:  [id number]   cc:   Dionne Ano. Everlene Other, M.D.

## 2010-09-04 NOTE — Procedures (Signed)
[facility]  Patient:    Megan Lutz, Megan Lutz Visit Number: [id number] MRN: [id number]          Service Type: END Location: ENDO Attending Physician:  Rich Brave Dictated by:   Florencia Reasons, M.D. Proc. Date: 04/03/01 Admit Date:  04/03/2001   CC:         Leroy Sea., M.D.   Procedure Report  PROCEDURE PERFORMED:  Colonoscopy.  ENDOSCOPIST:  Florencia Reasons, M.D.  INDICATIONS FOR PROCEDURE:  The patient is a 60 year old for screening becauise of a family  of colon cancer in her father, Mr. Linton Ham, who was a patient of mine.  He was diagnosed around age 34.  FINDINGS:  Normal exam to the cecum.  DESCRIPTION OF PROCEDURE:  The nature, purpose and risks of the procedure had been discussed with the patient, who provided written consent.  Sedation was fentanyl 40 mcg and Versed 4 mg IV without arrhythmias or desaturation.  The Olympus pediatric adjustable tensionvideo colonoscope was advanced to the cecum without difficulty and pullback was then performed.  The quality of the prep was excellent and it is felt that all areas were well seen.  This was a normal examination.  No polyps, cancer, colitis, vascular malformations or diverticulosis were observed.  Retroflexion was not accomplished in the rectum due to a small rectal ampulla.  No biopsies were obtained.  The patient tolerated the procedure well and there were no apparent complications.  IMPRESSION:  Normal colonoscopy.  PLAN:  Follow-up colonoscopy in five years in view of the family history.  Dictated by:   Florencia Reasons, M.D. Attending Physician:  Rich Brave DD:  04/03/01 TD:  04/03/01 Job: [phone] [id number]

## 2010-11-26 ENCOUNTER — Encounter: Payer: Self-pay | Admitting: Family Medicine

## 2011-03-25 ENCOUNTER — Other Ambulatory Visit: Payer: Self-pay | Admitting: Internal Medicine

## 2011-03-25 DIAGNOSIS — Z1231 Encounter for screening mammogram for malignant neoplasm of breast: Secondary | ICD-10-CM

## 2011-03-31 ENCOUNTER — Other Ambulatory Visit (INDEPENDENT_AMBULATORY_CARE_PROVIDER_SITE_OTHER): Payer: BC Managed Care – PPO

## 2011-03-31 DIAGNOSIS — Z0000 Encounter for general adult medical examination without abnormal findings: Secondary | ICD-10-CM

## 2011-03-31 LAB — CBC WITH DIFFERENTIAL/PLATELET
Basophils Relative: 0.7 % (ref 0.0–3.0)
Eosinophils Relative: 1.4 % (ref 0.0–5.0)
Hemoglobin: 13.2 g/dL (ref 12.0–15.0)
Lymphocytes Relative: 33.3 % (ref 12.0–46.0)
Monocytes Absolute: 0.4 10*3/uL (ref 0.1–1.0)
Neutro Abs: 2.7 10*3/uL (ref 1.4–7.7)
RBC: 4.21 Mil/uL (ref 3.87–5.11)
WBC: 4.8 10*3/uL (ref 4.5–10.5)

## 2011-03-31 LAB — BASIC METABOLIC PANEL
BUN: 16 mg/dL (ref 6–23)
CO2: 26 mEq/L (ref 19–32)
Creatinine, Ser: 0.8 mg/dL (ref 0.4–1.2)
GFR: 77.95 mL/min (ref >60.00)
Glucose, Bld: 89 mg/dL (ref 70–99)
Sodium: 141 mEq/L (ref 135–145)

## 2011-03-31 LAB — POCT URINALYSIS DIPSTICK
Bilirubin, UA: NEGATIVE
Ketones, UA: NEGATIVE
Leukocytes, UA: NEGATIVE
Nitrite, UA: NEGATIVE
Protein, UA: NEGATIVE
Spec Grav, UA: 1.025
Urobilinogen, UA: 0.2
pH, UA: 5.5

## 2011-03-31 LAB — HEPATIC FUNCTION PANEL
AST: 23 U/L (ref 0–37)
Albumin: 4 g/dL (ref 3.5–5.2)
Bilirubin, Direct: 0.1 mg/dL (ref 0.0–0.3)
Total Bilirubin: 0.7 mg/dL (ref 0.3–1.2)

## 2011-03-31 LAB — LIPID PANEL: Triglycerides: 80 mg/dL (ref 0.0–149.0)

## 2011-04-06 ENCOUNTER — Ambulatory Visit (INDEPENDENT_AMBULATORY_CARE_PROVIDER_SITE_OTHER): Payer: BC Managed Care – PPO | Admitting: Internal Medicine

## 2011-04-06 ENCOUNTER — Other Ambulatory Visit (HOSPITAL_COMMUNITY)
Admission: RE | Admit: 2011-04-06 | Discharge: 2011-04-06 | Disposition: A | Discharge status: Home / Self Care | Payer: BC Managed Care – PPO | Source: Ambulatory Visit | Attending: Internal Medicine | Admitting: Internal Medicine

## 2011-04-06 ENCOUNTER — Encounter: Payer: Self-pay | Admitting: Internal Medicine

## 2011-04-06 DIAGNOSIS — Z23 Encounter for immunization: Secondary | ICD-10-CM

## 2011-04-06 DIAGNOSIS — N959 Unspecified menopausal and perimenopausal disorder: Secondary | ICD-10-CM

## 2011-04-06 DIAGNOSIS — Z0000 Encounter for general adult medical examination without abnormal findings: Secondary | ICD-10-CM

## 2011-04-06 DIAGNOSIS — E785 Hyperlipidemia, unspecified: Secondary | ICD-10-CM

## 2011-04-06 DIAGNOSIS — Z01419 Encounter for gynecological examination (general) (routine) without abnormal findings: Secondary | ICD-10-CM | POA: Insufficient documentation

## 2011-04-06 DIAGNOSIS — M899 Disorder of bone, unspecified: Secondary | ICD-10-CM

## 2011-04-06 MED ORDER — PRAVASTATIN SODIUM 40 MG PO TABS: 40.0000 mg | ORAL_TABLET | Freq: Every day | ORAL | Status: DC

## 2011-04-06 NOTE — Patient Instructions (Signed)
It is important that you exercise regularly, at least 20 minutes 3 to 4 times per week.  If you develop chest pain or shortness of breath seek  medical attention.  Take a calcium supplement, plus 800-1200 units of vitamin D  Return in one year for follow-up   

## 2011-04-06 NOTE — Progress Notes (Signed)
Subjective:    Patient ID: Megan Lutz, female    DOB: [birth year], 60 y.o.   MRN: [id number]  HPI  60 year old patient who is seen today for a health maintenance examination. She enjoys excellent health. She has a history of dyslipidemia presently on Pravachol 40 mg daily. She is doing quite well. She has a history of mild menopausal syndrome as well as osteopenia.   Postmenopausal  Next colonoscope examination is due 2018.   cardiovascular history with mother having had a CVA and her father had 6 bypass with CAD.  Patient has hyperlipidemia    As frequent URIs or pharyngitis which is thought to be secondary to exposure by her granddaughter  Preventive Screening-Counseling & Management  Alcohol-Tobacco  Smoking Status: never   Current Medications (verified):   1) Aspirin Ec 81 Mg Tbec (Aspirin) .... Take 1 Tablet By Mouth Every Morning  2) Caltrate 600+d Plus 600-400 Mg-Unit Chew (Calcium Carbonate-Vit D-Min) .... Once Daily  3) Fish Oil Oil (Fish Oil) .... Once Daily  4) Vitamin D 36644 Unit Caps (Ergocalciferol) .Marland Kitchen.. 1 By Mouth Every Week   Allergies (verified):  1) Codeine Phosphate (Codeine Phosphate)   Past History:  Past Surgical History:  Last updated: 11/02/2006  Tonsillectomy  Risk Factors:  Smoking Status: never (04/02/2010)   Past Medical History:  Kidney Calculus  fracture left ring finger     Review of Systems  Constitutional: Negative for fever, appetite change, fatigue and unexpected weight change.  HENT: Negative for hearing loss, ear pain, nosebleeds, congestion, sore throat, mouth sores, trouble swallowing, neck stiffness, dental problem, voice change, sinus pressure and tinnitus.   Eyes: Negative for photophobia, pain, redness and visual disturbance.  Respiratory: Negative for cough, chest tightness and shortness of breath.   Cardiovascular: Negative for chest pain, palpitations and leg swelling.  Gastrointestinal: Negative for nausea,  vomiting, abdominal pain, diarrhea, constipation, blood in stool, abdominal distention and rectal pain.  Genitourinary: Negative for dysuria, urgency, frequency, hematuria, flank pain, vaginal bleeding, vaginal discharge, difficulty urinating, genital sores, vaginal pain, menstrual problem and pelvic pain.  Musculoskeletal: Negative for back pain and arthralgias.  Skin: Negative for rash.  Neurological: Negative for dizziness, syncope, speech difficulty, weakness, light-headedness, numbness and headaches.  Hematological: Negative for adenopathy. Does not bruise/bleed easily.  Psychiatric/Behavioral: Negative for suicidal ideas, behavioral problems, self-injury, dysphoric mood and agitation. The patient is not nervous/anxious.        Objective:   Physical Exam  Constitutional: She is oriented to person, place, and time. She appears well-developed and well-nourished.       Blood pressure low normal  HENT:  Head: Normocephalic and atraumatic.  Right Ear: External ear normal.  Left Ear: External ear normal.  Mouth/Throat: Oropharynx is clear and moist.  Eyes: Conjunctivae and EOM are normal.  Neck: Normal range of motion. Neck supple. No JVD present. No thyromegaly present.  Cardiovascular: Normal rate, regular rhythm, normal heart sounds and intact distal pulses.   No murmur heard. Pulmonary/Chest: Effort normal and breath sounds normal. She has no wheezes. She has no rales.  Abdominal: Soft. Bowel sounds are normal. She exhibits no distension and no mass. There is no tenderness. There is no rebound and no guarding.  Genitourinary: Vagina normal and uterus normal. Guaiac negative stool.  Musculoskeletal: Normal range of motion. She exhibits no edema and no tenderness.  Neurological: She is alert and oriented to person, place, and time. She has normal reflexes. No cranial nerve deficit. She exhibits normal muscle tone.  Coordination normal.  Skin: Skin is warm and dry. No rash noted.    Psychiatric: She has a normal mood and affect. Her behavior is normal.          Assessment & Plan:   Preventive health examination Mild dyslipidemia. Lipid profile at goal we'll continue Pravachol 40  Regular exercise encouraged as well as vitamin D supplements. Recheck 1 year

## 2011-04-07 ENCOUNTER — Encounter: Payer: BC Managed Care – PPO | Admitting: Family Medicine

## 2011-04-09 NOTE — Progress Notes (Signed)
Quick Note:  Attempt to call VM - LMTCB if question - results normal ______

## 2011-04-16 ENCOUNTER — Ambulatory Visit: Payer: BC Managed Care – PPO

## 2011-04-16 ENCOUNTER — Ambulatory Visit
Admission: RE | Admit: 2011-04-16 | Discharge: 2011-04-16 | Disposition: A | Discharge status: Home / Self Care | Payer: BC Managed Care – PPO | Source: Ambulatory Visit | Attending: Internal Medicine | Admitting: Internal Medicine

## 2011-04-16 DIAGNOSIS — Z1231 Encounter for screening mammogram for malignant neoplasm of breast: Secondary | ICD-10-CM

## 2011-04-20 DIAGNOSIS — K5790 Diverticulosis of intestine, part unspecified, without perforation or abscess without bleeding: Secondary | ICD-10-CM

## 2011-04-20 HISTORY — DX: Diverticulosis of intestine, part unspecified, without perforation or abscess without bleeding: K57.90

## 2011-04-28 ENCOUNTER — Other Ambulatory Visit: Payer: Self-pay

## 2011-04-28 MED ORDER — PRAVASTATIN SODIUM 40 MG PO TABS: 40.0000 mg | ORAL_TABLET | Freq: Every day | ORAL | Status: DC

## 2011-06-08 ENCOUNTER — Encounter: Payer: Self-pay | Admitting: Internal Medicine

## 2011-06-08 LAB — HM COLONOSCOPY

## 2011-07-15 ENCOUNTER — Telehealth: Payer: Self-pay | Admitting: Internal Medicine

## 2011-07-15 NOTE — Telephone Encounter (Signed)
Patient stated that her cpx was coded incorrectly and she would like to speak with the nurse to verify. Please call patient back. Patient can be reached on mobile phone 12-1 and at work [phone] until 5pm.

## 2011-07-15 NOTE — Telephone Encounter (Signed)
I have contacted the pt and resolved the issue.

## 2011-07-15 NOTE — Telephone Encounter (Signed)
Please advise 

## 2012-01-14 ENCOUNTER — Ambulatory Visit (INDEPENDENT_AMBULATORY_CARE_PROVIDER_SITE_OTHER): Payer: BC Managed Care – PPO

## 2012-01-14 DIAGNOSIS — Z23 Encounter for immunization: Secondary | ICD-10-CM

## 2012-03-30 ENCOUNTER — Other Ambulatory Visit (INDEPENDENT_AMBULATORY_CARE_PROVIDER_SITE_OTHER): Payer: BC Managed Care – PPO

## 2012-03-30 DIAGNOSIS — Z0000 Encounter for general adult medical examination without abnormal findings: Secondary | ICD-10-CM

## 2012-03-30 LAB — HEPATIC FUNCTION PANEL
ALT: 25 U/L (ref 0–35)
AST: 27 U/L (ref 0–37)
Albumin: 4 g/dL (ref 3.5–5.2)
Alkaline Phosphatase: 68 U/L (ref 39–117)
Total Bilirubin: 0.6 mg/dL (ref 0.3–1.2)

## 2012-03-30 LAB — LIPID PANEL
HDL: 55.2 mg/dL (ref >39.00)
LDL Cholesterol: 112 mg/dL — ABNORMAL HIGH (ref 0–99)
Total CHOL/HDL Ratio: 4
Triglycerides: 158 mg/dL — ABNORMAL HIGH (ref 0.0–149.0)

## 2012-03-30 LAB — CBC WITH DIFFERENTIAL/PLATELET
Eosinophils Relative: 2 % (ref 0.0–5.0)
HCT: 39.3 % (ref 36.0–46.0)
Hemoglobin: 13.2 g/dL (ref 12.0–15.0)
Lymphocytes Relative: 34.5 % (ref 12.0–46.0)
Lymphs Abs: 1.8 10*3/uL (ref 0.7–4.0)
Monocytes Relative: 10.1 % (ref 3.0–12.0)
Neutro Abs: 2.7 10*3/uL (ref 1.4–7.7)
WBC: 5.1 10*3/uL (ref 4.5–10.5)

## 2012-03-30 LAB — POCT URINALYSIS DIPSTICK
Bilirubin, UA: NEGATIVE
Blood, UA: NEGATIVE
Glucose, UA: NEGATIVE
Nitrite, UA: NEGATIVE
Spec Grav, UA: 1.015
pH, UA: 5.5

## 2012-03-30 LAB — BASIC METABOLIC PANEL
Calcium: 9.1 mg/dL (ref 8.4–10.5)
GFR: 69.59 mL/min (ref >60.00)
Potassium: 3.7 mEq/L (ref 3.5–5.1)
Sodium: 136 mEq/L (ref 135–145)

## 2012-03-30 LAB — TSH: TSH: 3.19 u[IU]/mL (ref 0.35–5.50)

## 2012-03-31 ENCOUNTER — Other Ambulatory Visit: Payer: Self-pay | Admitting: Internal Medicine

## 2012-03-31 DIAGNOSIS — Z1231 Encounter for screening mammogram for malignant neoplasm of breast: Secondary | ICD-10-CM

## 2012-04-06 ENCOUNTER — Encounter: Payer: BC Managed Care – PPO | Admitting: Internal Medicine

## 2012-04-10 ENCOUNTER — Encounter: Payer: Self-pay | Admitting: Internal Medicine

## 2012-04-10 ENCOUNTER — Ambulatory Visit (INDEPENDENT_AMBULATORY_CARE_PROVIDER_SITE_OTHER): Payer: BC Managed Care – PPO | Admitting: Internal Medicine

## 2012-04-10 VITALS — BP 118/70 | HR 72 | Temp 98.0°F | Resp 18 | Ht 64.75 in | Wt 154.0 lb

## 2012-04-10 DIAGNOSIS — Z0000 Encounter for general adult medical examination without abnormal findings: Secondary | ICD-10-CM

## 2012-04-10 DIAGNOSIS — E785 Hyperlipidemia, unspecified: Secondary | ICD-10-CM

## 2012-04-10 MED ORDER — PRAVASTATIN SODIUM 40 MG PO TABS: 40.0000 mg | ORAL_TABLET | Freq: Every day | ORAL | Status: DC

## 2012-04-10 NOTE — Progress Notes (Signed)
Subjective:    Patient ID: Megan Lutz, female    DOB: Sep 03, 1950, 61 y.o.   MRN: [id number]  HPI  61 year old patient who is seen today for a wellness exam Subjective:    Patient ID: Megan Lutz, female    DOB: 30-Jul-1950, 61 y.o.   MRN: [id number]  HPI 61 year-old patient who is seen today for a health maintenance examination. She enjoys excellent health. She has a history of dyslipidemia presently on Pravachol 40 mg daily. She is doing quite well. She has a history of mild menopausal syndrome as well as osteopenia.   Postmenopausal  Next colonoscope examination is due 2018.   cardiovascular history with mother having had a CVA and her father had 6 bypass with CAD.  Patient has hyperlipidemia    As frequent URIs or pharyngitis which is thought to be secondary to exposure by her granddaughter  Preventive Screening-Counseling & Management  Alcohol-Tobacco  Smoking Status: never   Current Medications (verified):   1) Aspirin Ec 81 Mg Tbec (Aspirin) .... Take 1 Tablet By Mouth Every Morning  2) Caltrate 600+d Plus 600-400 Mg-Unit Chew (Calcium Carbonate-Vit D-Min) .... Once Daily  3) Fish Oil Oil (Fish Oil) .... Once Daily  4) Vitamin D 48016 Unit Caps (Ergocalciferol) .Marland Kitchen.. 1 By Mouth Every Week   Allergies (verified):  1) Codeine Phosphate (Codeine Phosphate)   Past History:  Past Surgical History:  Last updated: 11/02/2006  Tonsillectomy  Risk Factors:  Smoking Status: never (04/02/2010)   Past Medical History:  Kidney Calculus  fracture left ring finger     Review of Systems  Constitutional: Negative for fever, appetite change, fatigue and unexpected weight change.  HENT: Negative for hearing loss, ear pain, nosebleeds, congestion, sore throat, mouth sores, trouble swallowing, neck stiffness, dental problem, voice change, sinus pressure and tinnitus.   Eyes: Negative for photophobia, pain, redness and visual disturbance.  Respiratory: Negative for cough,  chest tightness and shortness of breath.   Cardiovascular: Negative for chest pain, palpitations and leg swelling.  Gastrointestinal: Negative for nausea, vomiting, abdominal pain, diarrhea, constipation, blood in stool, abdominal distention and rectal pain.  Genitourinary: Negative for dysuria, urgency, frequency, hematuria, flank pain, vaginal bleeding, vaginal discharge, difficulty urinating, genital sores, vaginal pain, menstrual problem and pelvic pain.  Musculoskeletal: Negative for back pain and arthralgias.  Skin: Negative for rash.  Neurological: Negative for dizziness, syncope, speech difficulty, weakness, light-headedness, numbness and headaches.  Hematological: Negative for adenopathy. Does not bruise/bleed easily.  Psychiatric/Behavioral: Negative for suicidal ideas, behavioral problems, self-injury, dysphoric mood and agitation. The patient is not nervous/anxious.        Objective:   Physical Exam  Constitutional: She is oriented to person, place, and time. She appears well-developed and well-nourished.       Blood pressure low normal  HENT:  Head: Normocephalic and atraumatic.  Right Ear: External ear normal.  Left Ear: External ear normal.  Mouth/Throat: Oropharynx is clear and moist.  Eyes: Conjunctivae and EOM are normal.  Neck: Normal range of motion. Neck supple. No JVD present. No thyromegaly present.  Cardiovascular: Normal rate, regular rhythm, normal heart sounds and intact distal pulses.   No murmur heard. Pulmonary/Chest: Effort normal and breath sounds normal. She has no wheezes. She has no rales.  Abdominal: Soft. Bowel sounds are normal. She exhibits no distension and no mass. There is no tenderness. There is no rebound and no guarding.  Genitourinary: Vagina normal and uterus normal. Guaiac negative stool.  Musculoskeletal: Normal range  of motion. She exhibits no edema and no tenderness.  Neurological: She is alert and oriented to person, place, and time.  She has normal reflexes. No cranial nerve deficit. She exhibits normal muscle tone. Coordination normal.  Skin: Skin is warm and dry. No rash noted.  Psychiatric: She has a normal mood and affect. Her behavior is normal.          Assessment & Plan:   Preventive health examination Mild dyslipidemia. Lipid profile at goal we'll continue Pravachol 40  Regular exercise encouraged as well as vitamin D supplements. Recheck 1 year  Wt Readings from Last 3 Encounters:  04/10/12 154 lb (69.854 kg)  04/06/11 138 lb (62.596 kg)  04/02/10 154 lb (69.854 kg)    Review of Systems     Objective:   Physical Exam        Assessment & Plan:

## 2012-04-10 NOTE — Patient Instructions (Signed)
It is important that you exercise regularly, at least 20 minutes 3 to 4 times per week.  If you develop chest pain or shortness of breath seek  medical attention.  Take a calcium supplement, plus [phone] units of vitamin D  Return in one year for follow-up  Schedule your mammogram.

## 2012-04-27 ENCOUNTER — Ambulatory Visit: Payer: BC Managed Care – PPO | Admitting: Internal Medicine

## 2012-04-27 ENCOUNTER — Encounter: Payer: Self-pay | Admitting: Internal Medicine

## 2012-04-27 ENCOUNTER — Telehealth: Payer: Self-pay | Admitting: Internal Medicine

## 2012-04-27 ENCOUNTER — Emergency Department (HOSPITAL_BASED_OUTPATIENT_CLINIC_OR_DEPARTMENT_OTHER)
Admission: EM | Admit: 2012-04-27 | Discharge: 2012-04-27 | Disposition: A | Discharge status: Home / Self Care | Payer: BC Managed Care – PPO | Attending: Emergency Medicine | Admitting: Emergency Medicine

## 2012-04-27 ENCOUNTER — Encounter (HOSPITAL_BASED_OUTPATIENT_CLINIC_OR_DEPARTMENT_OTHER): Payer: Self-pay | Admitting: *Deleted

## 2012-04-27 VITALS — BP 154/110 | Temp 98.5°F | Wt 153.0 lb

## 2012-04-27 DIAGNOSIS — E785 Hyperlipidemia, unspecified: Secondary | ICD-10-CM | POA: Insufficient documentation

## 2012-04-27 DIAGNOSIS — Z862 Personal history of diseases of the blood and blood-forming organs and certain disorders involving the immune mechanism: Secondary | ICD-10-CM | POA: Insufficient documentation

## 2012-04-27 DIAGNOSIS — M899 Disorder of bone, unspecified: Secondary | ICD-10-CM | POA: Insufficient documentation

## 2012-04-27 DIAGNOSIS — Z8719 Personal history of other diseases of the digestive system: Secondary | ICD-10-CM | POA: Insufficient documentation

## 2012-04-27 DIAGNOSIS — Z209 Contact with and (suspected) exposure to unspecified communicable disease: Secondary | ICD-10-CM

## 2012-04-27 DIAGNOSIS — Z79899 Other long term (current) drug therapy: Secondary | ICD-10-CM | POA: Insufficient documentation

## 2012-04-27 DIAGNOSIS — M949 Disorder of cartilage, unspecified: Secondary | ICD-10-CM | POA: Insufficient documentation

## 2012-04-27 DIAGNOSIS — Z87442 Personal history of urinary calculi: Secondary | ICD-10-CM | POA: Insufficient documentation

## 2012-04-27 DIAGNOSIS — Z7982 Long term (current) use of aspirin: Secondary | ICD-10-CM | POA: Insufficient documentation

## 2012-04-27 DIAGNOSIS — Z0389 Encounter for observation for other suspected diseases and conditions ruled out: Secondary | ICD-10-CM | POA: Insufficient documentation

## 2012-04-27 HISTORY — DX: Calculus of kidney: N20.0

## 2012-04-27 NOTE — ED Notes (Signed)
NP at bedside.

## 2012-04-27 NOTE — ED Provider Notes (Signed)
History     CSN: [id number]  Arrival date & time 04/27/12  1700   First MD Initiated Contact with Patient 04/27/12 1718      Chief Complaint  Patient presents with  . Bat exposure     (Consider location/radiation/quality/duration/timing/severity/associated sxs/prior treatment) HPI Comments: Pt states that she had a bat fly across her bedroom last night and the bat was recovered by animal control and sent for testing:pt states that she was not bitten that she now of and she was told by her pcp and animal control to come in  The history is provided by the patient. No language interpreter was used.    Past Medical History  Diagnosis Date  . ANEMIA 09/25/2009  . HYPERLIPIDEMIA 12/07/2007  . OSTEOPENIA 12/07/2007  . PERIMENOPAUSAL SYNDROME 12/10/2008  . Diverticular disease 2013  . Hemorrhoid   . Kidney stone     Past Surgical History  Procedure Date  . Tonsillectomy     No family history on file.  History  Substance Use Topics  . Smoking status: Never Smoker   . Smokeless tobacco: Never Used  . Alcohol Use: Yes    OB History    Grav Para Term Preterm Abortions TAB SAB Ect Mult Living                  Review of Systems  Constitutional: Negative.   Respiratory: Negative.   Cardiovascular: Negative.     Allergies  Codeine phosphate  Home Medications   Current Outpatient Rx  Name  Route  Sig  Dispense  Refill  . ASPIRIN 81 MG PO TABS   Oral   Take 81 mg by mouth daily.           Marland Kitchen CALCIUM CARBONATE-VITAMIN D 600-400 MG-UNIT PO TABS   Oral   Take 1 tablet by mouth daily.           Marland Kitchen FISH OIL 1000 MG PO CAPS   Oral   Take by mouth daily.           Marland Kitchen PRAVASTATIN SODIUM 40 MG PO TABS   Oral   Take 1 tablet (40 mg total) by mouth daily.   90 tablet   3     BP 141/79  Pulse 85  Temp 98.2 F (36.8 C) (Oral)  Resp 16  Wt 152 lb 14.4 oz (69.355 kg)  SpO2 100%  Physical Exam  Nursing note and vitals reviewed. Constitutional: She is oriented  to person, place, and time. She appears well-developed and well-nourished.  Cardiovascular: Normal rate and regular rhythm.   Pulmonary/Chest: Effort normal and breath sounds normal.  Neurological: She is alert and oriented to person, place, and time.  Skin: Skin is warm and dry.  Psychiatric: She has a normal mood and affect.    ED Course  Procedures (including critical care time)  Labs Reviewed - No data to display No results found.   1. Exposure to bat without known bite       MDM  Discussed with pt the recommendations and that she can wait until the results come back:pt verbalized understanding and she is opting to wait to have the vaccine        Teressa Lower, NP 04/27/12 1818

## 2012-04-27 NOTE — Assessment & Plan Note (Signed)
Discussed clinical scenario with infectious disease specialist-Dr. Ninetta Lights. He agrees that patient should be given rabies immunoglobulin and first dose of rabies vaccine. If testing for rabies on bat comes back negative on Monday from animal services, then no further rabies vaccine needed.  Otherwise, she will complete full series of rabies vaccine.  Patient referred to [facility] emergency room to obtain rabies immune globulin and rabies vaccine.

## 2012-04-27 NOTE — ED Notes (Signed)
Spoke with pt at length regarding rabies vaccinations. Pt declines vaccine at this time. NP made aware.

## 2012-04-27 NOTE — Progress Notes (Signed)
  Subjective:    Patient ID: Megan Lutz, female    DOB: 1950/11/21, 62 y.o.   MRN: [id number]  HPI  62 year old white female presents with bat exposure. Patient discovered a bat in her bedroom last night. She called animal services and it was removed. She denies any contact with a bat or possible bite. However she is not sure how long the bat could have been in her attic or bedroom. She moved some boxes from her attic around Christmas time.  Bat was captured by animal services and is in the process of undergoing testing for rabies.  Patient complains of mild headache but this is not unusual for patient. She's had intermittent tingling in her right arm.  Review of Systems Negative for nausea or vomiting  Past Medical History  Diagnosis Date  . ANEMIA 09/25/2009  . HYPERLIPIDEMIA 12/07/2007  . OSTEOPENIA 12/07/2007  . PERIMENOPAUSAL SYNDROME 12/10/2008  . Diverticular disease 2013  . Hemorrhoid     History   Social History  . Marital Status: Divorced    Spouse Name: N/A    Number of Children: N/A  . Years of Education: N/A   Occupational History  . Not on file.   Social History Main Topics  . Smoking status: Never Smoker   . Smokeless tobacco: Never Used  . Alcohol Use: Not on file  . Drug Use: Not on file  . Sexually Active: Not on file   Other Topics Concern  . Not on file   Social History Narrative  . No narrative on file    Past Surgical History  Procedure Date  . Tonsillectomy     No family history on file.  Allergies  Allergen Reactions  . Codeine Phosphate     REACTION: unspecified    Current Outpatient Prescriptions on File Prior to Visit  Medication Sig Dispense Refill  . aspirin 81 MG tablet Take 81 mg by mouth daily.        . Calcium Carbonate-Vitamin D (CALTRATE 600+D) 600-400 MG-UNIT per tablet Take 1 tablet by mouth daily.        . Omega-3 Fatty Acids (FISH OIL) 1000 MG CAPS Take by mouth daily.        . pravastatin (PRAVACHOL) 40 MG  tablet Take 1 tablet (40 mg total) by mouth daily.  90 tablet  3    BP 154/110  Temp 98.5 F (36.9 C) (Oral)  Wt 153 lb (69.4 kg)       Objective:   Physical Exam  Constitutional: She is oriented to person, place, and time. She appears well-developed and well-nourished.  HENT:  Head: Normocephalic and atraumatic.  Eyes: EOM are normal. Pupils are equal, round, and reactive to light.  Cardiovascular: Normal rate, regular rhythm and normal heart sounds.   Pulmonary/Chest: Effort normal and breath sounds normal. She has no wheezes.  Neurological: She is alert and oriented to person, place, and time. No cranial nerve deficit.  Psychiatric:       Anxious and tearful          Assessment & Plan:

## 2012-04-27 NOTE — ED Notes (Signed)
Bat was seen in home last night-was sent by PCP for possible rabies-[facility] [employer] control came to home-removed bat and sent it to [facility]

## 2012-04-27 NOTE — Telephone Encounter (Signed)
Patient Information:  Caller Name: Analleli  Phone: [phone]  Patient: Megan Lutz, Megan Lutz  Gender: Female  DOB: 1951/01/22  Age: 62 Years  PCP: Eleonore Chiquito [facility])  Office Follow Up:  Does the office need to follow up with this patient?: No  Instructions For The Office: N/A   Symptoms  Reason For Call & Symptoms: Patient states a BAT in her bedroom last night.  Animal Control called and they got the bat.  She states she was cautioned about Bat exposure and is scared.  Can she get immunizations and follow up with the office.  She states she saw the bat fluttering and is not aware of it touching/getting close to her.  Unsure of last Tetanus Immunization  Reviewed Health History In EMR: Yes  Reviewed Medications In EMR: Yes  Reviewed Allergies In EMR: Yes  Reviewed Surgeries / Procedures: No  Date of Onset of Symptoms: 04/26/2012  Guideline(s) Used:  Lobbyist  Disposition Per Guideline:   See Today in Office  Reason For Disposition Reached:   No bite mark and suspicious bat exposure (e.g., bat found in same room as sleeping adult)  Advice Given:  [place] - Human resources officer Control:  For patients referred in for evaluation, the ED or PCP will call the Animal Control Center.  For patients not referred in, the patient should call the Animal Control Center in the county where the bite occurred if a rabies-prone wild animal or stray pet animal attempted to bite an adult but was unsuccessful (i.e., the adult does not need to be seen). The [employer] will initiate a search for the animal. If located, any rabies-prone animals will be observed 10 days for rabies or sacrificed and tested for rabies. Dangerous strays will be taken to the local animal shelter.  Appointment Scheduled:  04/27/2012 15:00:00 Appointment Scheduled Provider:  Artist Pais, Doe-Hyun Molly Maduro) (Adults only)

## 2012-04-28 NOTE — ED Provider Notes (Signed)
Medical screening examination/treatment/procedure(s) were performed by non-physician practitioner and as supervising physician I was immediately available for consultation/collaboration.     Deloise Marchant R Abou Sterkel, MD 04/28/12 0002 

## 2012-05-08 ENCOUNTER — Ambulatory Visit
Admission: RE | Admit: 2012-05-08 | Discharge: 2012-05-08 | Disposition: A | Discharge status: Home / Self Care | Payer: BC Managed Care – PPO | Source: Ambulatory Visit | Attending: Internal Medicine | Admitting: Internal Medicine

## 2012-05-08 DIAGNOSIS — Z1231 Encounter for screening mammogram for malignant neoplasm of breast: Secondary | ICD-10-CM

## 2012-08-03 ENCOUNTER — Ambulatory Visit (INDEPENDENT_AMBULATORY_CARE_PROVIDER_SITE_OTHER): Payer: BC Managed Care – PPO | Admitting: Family Medicine

## 2012-08-03 ENCOUNTER — Encounter: Payer: Self-pay | Admitting: Family Medicine

## 2012-08-03 VITALS — BP 104/70 | HR 86 | Temp 98.2°F

## 2012-08-03 DIAGNOSIS — J069 Acute upper respiratory infection, unspecified: Secondary | ICD-10-CM

## 2012-08-03 MED ORDER — AZITHROMYCIN 250 MG PO TABS: ORAL_TABLET | ORAL | Status: DC

## 2012-08-03 NOTE — Progress Notes (Signed)
Chief Complaint  Patient presents with  . Bronchitis    HPI:  Acute visit for cough: -started: 2 weeks ago - seems to be getting a little better -symptoms:nasal congestion, sore throat, cough, hoarsness -denies:fever, SOB, NVD, tooth pain -has tried: robitussin -sick contacts: none known -Hx of: hx of allergies, bronchitis   ROS: See pertinent positives and negatives per HPI.  Past Medical History  Diagnosis Date  . ANEMIA 09/25/2009  . HYPERLIPIDEMIA 12/07/2007  . OSTEOPENIA 12/07/2007  . PERIMENOPAUSAL SYNDROME 12/10/2008  . Diverticular disease 2013  . Hemorrhoid   . Kidney stone     No family history on file.  History   Social History  . Marital Status: Divorced    Spouse Name: N/A    Number of Children: N/A  . Years of Education: N/A   Social History Main Topics  . Smoking status: Never Smoker   . Smokeless tobacco: Never Used  . Alcohol Use: Yes  . Drug Use: No  . Sexually Active: None   Other Topics Concern  . None   Social History Narrative  . None    Current outpatient prescriptions:aspirin 81 MG tablet, Take 81 mg by mouth daily.  , Disp: , Rfl: ;  pravastatin (PRAVACHOL) 40 MG tablet, Take 1 tablet (40 mg total) by mouth daily., Disp: 90 tablet, Rfl: 3;  azithromycin (ZITHROMAX) 250 MG tablet, 2 tabs on first day, then 1 tab daily for 4 days, Disp: 6 tablet, Rfl: 0;  Calcium Carbonate-Vitamin D (CALTRATE 600+D) 600-400 MG-UNIT per tablet, Take 1 tablet by mouth daily.  , Disp: , Rfl:  Omega-3 Fatty Acids (FISH OIL) 1000 MG CAPS, Take by mouth daily.  , Disp: , Rfl:   EXAM:  Filed Vitals:   08/03/12 1106  BP: 104/70  Pulse: 86  Temp: 98.2 F (36.8 C)    Body mass index is 0.00 kg/(m^2).  GENERAL: vitals reviewed and listed above, alert, oriented, appears well hydrated and in no acute distress  HEENT: atraumatic, conjunttiva clear, no obvious abnormalities on inspection of external nose and ears, normal appearance of ear canals and TMs, clear  nasal congestion, mild post oropharyngeal erythema with PND, no tonsillar edema or exudate, no sinus TTP  NECK: no obvious masses on inspection  LUNGS: clear to auscultation bilaterally, no wheezes, rales or rhonchi, good air movement  CV: HRRR, no peripheral edema  MS: moves all extremities without noticeable abnormality  PSYCH: pleasant and cooperative, no obvious depression or anxiety  ASSESSMENT AND PLAN:  Discussed the following assessment and plan:  Upper respiratory infection - Plan: azithromycin (ZITHROMAX) 250 MG tablet  -discussed most likely viral, discussed supportive care - abx incase worsening or persisting - risks discussed. She decline cough medication. Return precautions. -Patient advised to return or notify a doctor immediately if symptoms worsen or persist or new concerns arise.  Patient Instructions  INSTRUCTIONS FOR UPPER RESPIRATORY INFECTION:  -plenty of rest and fluids  -nasal saline wash 2-3 times daily (use prepackaged nasal saline or bottled/distilled water if making your own)   -clean nose with nasal saline before using the nasal steroid or sinex  -can use sinex nasal spray for drainage and nasal congestion - but do NOT use longer then 3-4 days  -can use tylenol or ibuprofen as directed for aches and sorethroat  -in the winter time, using a humidifier at night is helpful (please follow cleaning instructions)  -if you are taking a cough medication - use only as directed, may also try  a teaspoon of honey to coat the throat and throat lozenges  -for sore throat, salt water gargles can help  -follow up if you have fevers, facial pain, tooth pain, difficulty breathing or are worsening or not getting better in 5-7 days      Duwane Gewirtz R.

## 2012-08-03 NOTE — Patient Instructions (Addendum)

## 2012-11-29 ENCOUNTER — Telehealth: Payer: Self-pay | Admitting: Internal Medicine

## 2012-11-29 NOTE — Telephone Encounter (Signed)
PT states that she would like to be seen for a pap smear. She states that she does not want to wait until her CPX in December, but that she wants to schedule it now. She states that at the time of her CPX last year, she did not receive a pap because the doctor refused to do it. She states that she would like to now redeem it as a preventative matter. Please assist.

## 2012-11-30 NOTE — Telephone Encounter (Signed)
Left message on voicemail to call office.  

## 2012-12-01 NOTE — Telephone Encounter (Signed)
Per Tim Lair, ok to do pap. Not done at [facility] in Dec.2013.  Appt scheduled.

## 2012-12-15 ENCOUNTER — Other Ambulatory Visit (HOSPITAL_COMMUNITY)
Admission: RE | Admit: 2012-12-15 | Discharge: 2012-12-15 | Disposition: A | Discharge status: Home / Self Care | Payer: BC Managed Care – PPO | Source: Ambulatory Visit | Attending: Internal Medicine | Admitting: Internal Medicine

## 2012-12-15 ENCOUNTER — Encounter: Payer: Self-pay | Admitting: Internal Medicine

## 2012-12-15 ENCOUNTER — Ambulatory Visit (INDEPENDENT_AMBULATORY_CARE_PROVIDER_SITE_OTHER): Payer: BC Managed Care – PPO | Admitting: Internal Medicine

## 2012-12-15 VITALS — BP 110/70 | HR 75 | Temp 98.1°F | Resp 20 | Wt 159.0 lb

## 2012-12-15 DIAGNOSIS — E785 Hyperlipidemia, unspecified: Secondary | ICD-10-CM

## 2012-12-15 DIAGNOSIS — Z01419 Encounter for gynecological examination (general) (routine) without abnormal findings: Secondary | ICD-10-CM | POA: Insufficient documentation

## 2012-12-15 DIAGNOSIS — Z299 Encounter for prophylactic measures, unspecified: Secondary | ICD-10-CM

## 2012-12-15 DIAGNOSIS — Z0000 Encounter for general adult medical examination without abnormal findings: Secondary | ICD-10-CM

## 2012-12-15 NOTE — Patient Instructions (Signed)
Limit your sodium (Salt) intake    It is important that you exercise regularly, at least 20 minutes 3 to 4 times per week.  If you develop chest pain or shortness of breath seek  medical attention.  Call or return to clinic prn if these symptoms worsen or fail to improve as anticipated.  

## 2012-12-15 NOTE — Progress Notes (Signed)
  Subjective:    Patient ID: Megan Lutz, female    DOB: [birth year], 62 y.o.   MRN: [id number]  HPI  62 year old patient who is seen today for a health maintenance  Pap.  She has a family history of cancer in her last Pap was 2 years ago. She is on five-year interval colonoscopy is due to a family history of colon cancer (father)- her mother died of esophageal cancer. No postmenopausal bleeding or other issues  Past Medical History  Diagnosis Date  . ANEMIA 09/25/2009  . HYPERLIPIDEMIA 12/07/2007  . OSTEOPENIA 12/07/2007  . PERIMENOPAUSAL SYNDROME 12/10/2008  . Diverticular disease 2013  . Hemorrhoid   . Kidney stone     History   Social History  . Marital Status: Divorced    Spouse Name: N/A    Number of Children: N/A  . Years of Education: N/A   Occupational History  . Not on file.   Social History Main Topics  . Smoking status: Never Smoker   . Smokeless tobacco: Never Used  . Alcohol Use: Yes  . Drug Use: No  . Sexual Activity: Not on file   Other Topics Concern  . Not on file   Social History Narrative  . No narrative on file    Past Surgical History  Procedure Laterality Date  . Tonsillectomy      No family history on file.  Allergies  Allergen Reactions  . Codeine Phosphate Nausea And Vomiting    Current Outpatient Prescriptions on File Prior to Visit  Medication Sig Dispense Refill  . aspirin 81 MG tablet Take 81 mg by mouth daily.        . Calcium Carbonate-Vitamin D (CALTRATE 600+D) 600-400 MG-UNIT per tablet Take 1 tablet by mouth daily.        . Omega-3 Fatty Acids (FISH OIL) 1000 MG CAPS Take by mouth daily.        . pravastatin (PRAVACHOL) 40 MG tablet Take 1 tablet (40 mg total) by mouth daily.  90 tablet  3   No current facility-administered medications on file prior to visit.    BP 110/70  Pulse 75  Temp(Src) 98.1 F (36.7 C) (Oral)  Resp 20  Wt 159 lb (72.122 kg)  BMI 26.65 kg/m2  SpO2 97%       Review of Systems   Constitutional: Negative.   HENT: Negative for hearing loss, congestion, sore throat, rhinorrhea, dental problem, sinus pressure and tinnitus.   Eyes: Negative for pain, discharge and visual disturbance.  Respiratory: Negative for cough and shortness of breath.   Cardiovascular: Negative for chest pain, palpitations and leg swelling.  Gastrointestinal: Negative for nausea, vomiting, abdominal pain, diarrhea, constipation, blood in stool and abdominal distention.  Genitourinary: Negative for dysuria, urgency, frequency, hematuria, flank pain, vaginal bleeding, vaginal discharge, difficulty urinating, vaginal pain and pelvic pain.  Musculoskeletal: Positive for back pain. Negative for joint swelling, arthralgias and gait problem.  Skin: Negative for rash.  Neurological: Negative for dizziness, syncope, speech difficulty, weakness, numbness and headaches.  Hematological: Negative for adenopathy.  Psychiatric/Behavioral: Negative for behavioral problems, dysphoric mood and agitation. The patient is not nervous/anxious.        Objective:   Physical Exam  Constitutional: She appears well-developed and well-nourished. No distress.  Genitourinary: Vagina normal and uterus normal. No vaginal discharge found.  Pelvic examination unremarkable Cervix normal Pap specimen obtained          Assessment & Plan:   Preventive health examination Dyslipidemia stable

## 2013-03-18 ENCOUNTER — Ambulatory Visit: Payer: BC Managed Care – PPO | Admitting: Family Medicine

## 2013-03-18 VITALS — BP 110/70 | HR 72 | Temp 99.1°F | Resp 16 | Ht 65.5 in | Wt 160.0 lb

## 2013-03-18 DIAGNOSIS — J209 Acute bronchitis, unspecified: Secondary | ICD-10-CM

## 2013-03-18 DIAGNOSIS — J9801 Acute bronchospasm: Secondary | ICD-10-CM

## 2013-03-18 MED ORDER — BENZONATATE 100 MG PO CAPS: 100.0000 mg | ORAL_CAPSULE | Freq: Three times a day (TID) | ORAL | Status: DC | PRN

## 2013-03-18 MED ORDER — AZITHROMYCIN 250 MG PO TABS: ORAL_TABLET | ORAL | Status: DC

## 2013-03-18 NOTE — Progress Notes (Addendum)
Subjective:    Patient ID: Megan Lutz, female    DOB: Aug 22, 1950, 62 y.o.   MRN: [id number]  This chart was scribed for Rohil Lesch-MD, by Ladona Ridgel Day, Scribe. This patient was seen in room 12 and the patient's care was started at 4:46 PM.  HPI Megan Lutz is a 62 y.o. female who presents to the [facility] complaining of a constant, gradually worsened dry, hoarse cough, onset yesterday. She states it feels similar to a previous episode of bronchitis years ago (she has no hx of asthma). She reports associated HA, sore scratchy throat and mild myalgias. She tried delsym which seemed to help her HA and myalgias. She denies any associated fever/chills/sweats, rhinorrhea, congestion, postnasal drip, SOB, chest tightness, nausea, emesis or diarrhea. She states has tried Z-pack and Advair in the past w/moderate relief. Has been told by two previous physicians that she did suffer with asthma but her previous PCP had never confirmed this diagnosis for her.  She gets bronchitis very easily.   She has not had the flu shot She has no hx of glaucoma. She has never been a smoker. She works for a [employer].  Past Medical History  Diagnosis Date  . ANEMIA 09/25/2009  . HYPERLIPIDEMIA 12/07/2007  . OSTEOPENIA 12/07/2007  . PERIMENOPAUSAL SYNDROME 12/10/2008  . Diverticular disease 2013  . Hemorrhoid   . Kidney stone   . Allergy     Past Surgical History  Procedure Laterality Date  . Tonsillectomy    . Fracture surgery      Family History  Problem Relation Age of Onset  . Cancer Mother   . Cancer Father     History   Social History  . Marital Status: Divorced    Spouse Name: N/A    Number of Children: N/A  . Years of Education: N/A   Occupational History  . Not on file.   Social History Main Topics  . Smoking status: Never Smoker   . Smokeless tobacco: Never Used  . Alcohol Use: Yes  . Drug Use: No  . Sexual Activity:  Not on file   Other Topics Concern  . Not on file   Social History Narrative  . No narrative on file    Allergies  Allergen Reactions  . Codeine Phosphate Nausea And Vomiting    Patient Active Problem List   Diagnosis Date Noted  . Exposure to bat without known bite 04/27/2012  . ANEMIA 09/25/2009  . PERIMENOPAUSAL SYNDROME 12/10/2008  . HYPERLIPIDEMIA 12/07/2007  . OSTEOPENIA 12/07/2007    Review of Systems  Constitutional: Negative for fever and chills.  HENT: Positive for sore throat. Negative for congestion, ear discharge, ear pain, postnasal drip, rhinorrhea and sinus pressure.   Respiratory: Positive for cough. Negative for chest tightness, shortness of breath and wheezing.   Cardiovascular: Negative for chest pain.  Gastrointestinal: Negative for nausea, vomiting, abdominal pain and diarrhea.  Musculoskeletal: Negative for back pain.  Skin: Negative for color change.  Neurological: Positive for headaches. Negative for syncope.   Triage Vitals: BP 110/70  Pulse 72  Temp(Src) 99.1 F (37.3 C) (Oral)  Resp 16  Ht 5' 5.5" (1.664 m)  Wt 160 lb (72.576 kg)  BMI 26.21 kg/m2  SpO2 97%     Objective:   Physical Exam  Nursing note and vitals reviewed. Constitutional: She is oriented to person, place, and time. She appears well-developed and well-nourished. No distress.  HENT:  Head:  Normocephalic and atraumatic.  Right Ear: External ear normal.  Left Ear: External ear normal.  Mouth/Throat: Oropharynx is clear and moist. No oropharyngeal exudate.  Eyes: Conjunctivae are normal. Pupils are equal, round, and reactive to light. Right eye exhibits no discharge. Left eye exhibits no discharge.  Neck: Normal range of motion. Neck supple. No tracheal deviation present.  Cardiovascular: Normal rate, regular rhythm and normal heart sounds.   No murmur heard. Pulmonary/Chest: Effort normal and breath sounds normal. No respiratory distress. She has no wheezes. She has no  rales.  Harsh barking cough during exam.  Musculoskeletal: Normal range of motion. She exhibits no edema.  Lymphadenopathy:    She has no cervical adenopathy.  Neurological: She is alert and oriented to person, place, and time.  Skin: Skin is warm and dry.  Psychiatric: She has a normal mood and affect. Thought content normal.   ALBUTEROL/ATROVENT NEBULIZER ADMINISTERED IN OFFICE.     Assessment & Plan:   1. Acute bronchitis   2. Bronchospasm    1. Acute bronchitis:  New.  Rx for Zpack provided; continue Delsym or Rotibussin PRN.  Rx for Tessalon perles also provided.  RTC for acute worsening or development of SOB. 2.  Bronchospasm: New/recurrent in patient; question history of asthma; s/p Albuterol/Atrovent nebulizer in office to treat any underlying bronchospasm which may be contributing to barking cough.  Meds ordered this encounter  Medications  . azithromycin (ZITHROMAX) 250 MG tablet    Sig: Two tablets daily x 1 day then one tablet daily x 4 days    Dispense:  6 tablet    Refill:  0  . benzonatate (TESSALON) 100 MG capsule    Sig: Take 1-2 capsules (100-200 mg total) by mouth 3 (three) times daily as needed for cough.    Dispense:  60 capsule    Refill:  0    I personally performed the services described in this documentation, which was scribed in my presence.  The recorded information has been reviewed and is accurate.   Nilda Simmer, M.D.  [facility] [address] [place], [place]  [address] [phone] phone [phone] fax

## 2013-03-18 NOTE — Patient Instructions (Signed)
1. Continue Delsym twice daily for cough. 2.  You can use Tessalon Perles 1-2 capsules three times daily as needed for cough.

## 2013-06-11 ENCOUNTER — Other Ambulatory Visit: Payer: Self-pay | Admitting: Internal Medicine

## 2013-06-27 ENCOUNTER — Other Ambulatory Visit: Payer: Self-pay

## 2013-06-27 DIAGNOSIS — Z1231 Encounter for screening mammogram for malignant neoplasm of breast: Secondary | ICD-10-CM

## 2013-07-09 ENCOUNTER — Ambulatory Visit
Admission: RE | Admit: 2013-07-09 | Discharge: 2013-07-09 | Disposition: A | Discharge status: Home / Self Care | Payer: BC Managed Care – PPO | Source: Ambulatory Visit

## 2013-07-09 DIAGNOSIS — Z1231 Encounter for screening mammogram for malignant neoplasm of breast: Secondary | ICD-10-CM

## 2013-12-13 ENCOUNTER — Other Ambulatory Visit (INDEPENDENT_AMBULATORY_CARE_PROVIDER_SITE_OTHER): Payer: BC Managed Care – PPO

## 2013-12-13 DIAGNOSIS — Z0000 Encounter for general adult medical examination without abnormal findings: Secondary | ICD-10-CM

## 2013-12-13 LAB — CBC WITH DIFFERENTIAL/PLATELET
Basophils Absolute: 0 10*3/uL (ref 0.0–0.1)
Basophils Relative: 0.8 % (ref 0.0–3.0)
Eosinophils Absolute: 0.1 10*3/uL (ref 0.0–0.7)
Eosinophils Relative: 2.3 % (ref 0.0–5.0)
HCT: 40.4 % (ref 36.0–46.0)
Hemoglobin: 13.8 g/dL (ref 12.0–15.0)
Lymphocytes Relative: 36.6 % (ref 12.0–46.0)
Lymphs Abs: 1.9 10*3/uL (ref 0.7–4.0)
MCHC: 34 g/dL (ref 30.0–36.0)
MCV: 90.5 fl (ref 78.0–100.0)
Monocytes Absolute: 0.6 10*3/uL (ref 0.1–1.0)
Monocytes Relative: 10.8 % (ref 3.0–12.0)
NEUTROS PCT: 49.5 % (ref 43.0–77.0)
Neutro Abs: 2.6 10*3/uL (ref 1.4–7.7)
PLATELETS: 280 10*3/uL (ref 150.0–400.0)
RBC: 4.47 Mil/uL (ref 3.87–5.11)
RDW: 12.8 % (ref 11.5–15.5)
WBC: 5.3 10*3/uL (ref 4.0–10.5)

## 2013-12-13 LAB — HEPATIC FUNCTION PANEL
ALT: 36 U/L — ABNORMAL HIGH (ref 0–35)
AST: 35 U/L (ref 0–37)
Albumin: 4 g/dL (ref 3.5–5.2)
Alkaline Phosphatase: 73 U/L (ref 39–117)
BILIRUBIN DIRECT: 0.1 mg/dL (ref 0.0–0.3)
BILIRUBIN TOTAL: 0.7 mg/dL (ref 0.2–1.2)
Total Protein: 7.4 g/dL (ref 6.0–8.3)

## 2013-12-13 LAB — LIPID PANEL
CHOLESTEROL: 196 mg/dL (ref 0–200)
HDL: 56.2 mg/dL (ref >39.00)
LDL Cholesterol: 106 mg/dL — ABNORMAL HIGH (ref 0–99)
NonHDL: 139.8
Total CHOL/HDL Ratio: 3
Triglycerides: 167 mg/dL — ABNORMAL HIGH (ref 0.0–149.0)
VLDL: 33.4 mg/dL (ref 0.0–40.0)

## 2013-12-13 LAB — BASIC METABOLIC PANEL
BUN: 14 mg/dL (ref 6–23)
CHLORIDE: 108 meq/L (ref 96–112)
CO2: 28 mEq/L (ref 19–32)
Calcium: 9.5 mg/dL (ref 8.4–10.5)
Creatinine, Ser: 0.9 mg/dL (ref 0.4–1.2)
GFR: 64.13 mL/min (ref >60.00)
Glucose, Bld: 89 mg/dL (ref 70–99)
Potassium: 4.8 mEq/L (ref 3.5–5.1)
Sodium: 142 mEq/L (ref 135–145)

## 2013-12-13 LAB — POCT URINALYSIS DIPSTICK
Bilirubin, UA: NEGATIVE
Blood, UA: NEGATIVE
Glucose, UA: NEGATIVE
KETONES UA: NEGATIVE
LEUKOCYTES UA: NEGATIVE
NITRITE UA: NEGATIVE
PROTEIN UA: NEGATIVE
Spec Grav, UA: 1.015
Urobilinogen, UA: 0.2
pH, UA: 5.5

## 2013-12-13 LAB — TSH: TSH: 4.14 u[IU]/mL (ref 0.35–4.50)

## 2013-12-17 ENCOUNTER — Encounter: Payer: Self-pay | Admitting: Internal Medicine

## 2013-12-17 ENCOUNTER — Ambulatory Visit (INDEPENDENT_AMBULATORY_CARE_PROVIDER_SITE_OTHER): Payer: BC Managed Care – PPO | Admitting: Internal Medicine

## 2013-12-17 ENCOUNTER — Other Ambulatory Visit (HOSPITAL_COMMUNITY)
Admission: RE | Admit: 2013-12-17 | Discharge: 2013-12-17 | Disposition: A | Discharge status: Home / Self Care | Payer: BC Managed Care – PPO | Source: Ambulatory Visit | Attending: Internal Medicine | Admitting: Internal Medicine

## 2013-12-17 VITALS — BP 127/76 | HR 87 | Temp 97.7°F | Resp 20 | Ht 65.0 in | Wt 159.0 lb

## 2013-12-17 DIAGNOSIS — M949 Disorder of cartilage, unspecified: Secondary | ICD-10-CM

## 2013-12-17 DIAGNOSIS — Z01419 Encounter for gynecological examination (general) (routine) without abnormal findings: Secondary | ICD-10-CM | POA: Diagnosis present

## 2013-12-17 DIAGNOSIS — Z23 Encounter for immunization: Secondary | ICD-10-CM

## 2013-12-17 DIAGNOSIS — Z0000 Encounter for general adult medical examination without abnormal findings: Secondary | ICD-10-CM

## 2013-12-17 DIAGNOSIS — M899 Disorder of bone, unspecified: Secondary | ICD-10-CM

## 2013-12-17 DIAGNOSIS — E785 Hyperlipidemia, unspecified: Secondary | ICD-10-CM

## 2013-12-17 MED ORDER — PRAVASTATIN SODIUM 40 MG PO TABS: ORAL_TABLET | ORAL | Status: DC

## 2013-12-17 NOTE — Patient Instructions (Signed)
Health Maintenance Adopting a healthy lifestyle and getting preventive care can go a long way to promote health and wellness. Talk with your health care provider about what schedule of regular examinations is right for you. This is a good chance for you to check in with your provider about disease prevention and staying healthy. In between checkups, there are plenty of things you can do on your own. Experts have done a lot of research about which lifestyle changes and preventive measures are most likely to keep you healthy. Ask your health care provider for more information. WEIGHT AND DIET  Eat a healthy diet  Be sure to include plenty of vegetables, fruits, low-fat dairy products, and lean protein.  Do not eat a lot of foods high in solid fats, added sugars, or salt.  Get regular exercise. This is one of the most important things you can do for your health.  Most adults should exercise for at least 150 minutes each week. The exercise should increase your heart rate and make you sweat (moderate-intensity exercise).  Most adults should also do strengthening exercises at least twice a week. This is in addition to the moderate-intensity exercise.  Maintain a healthy weight  Body mass index (BMI) is a measurement that can be used to identify possible weight problems. It estimates body fat based on height and weight. Your health care provider can help determine your BMI and help you achieve or maintain a healthy weight.  For females 25 years of age and older:   A BMI below 18.5 is considered underweight.  A BMI of 18.5 to 24.9 is normal.  A BMI of 25 to 29.9 is considered overweight.  A BMI of 30 and above is considered obese.  Watch levels of cholesterol and blood lipids  You should start having your blood tested for lipids and cholesterol at 62 years of age, then have this test every 5 years.  You may need to have your cholesterol levels checked more often if:  Your lipid or  cholesterol levels are high.  You are older than 62 years of age.  You are at high risk for heart disease.  CANCER SCREENING   Lung Cancer  Lung cancer screening is recommended for adults 97-92 years old who are at high risk for lung cancer because of a history of smoking.  A yearly low-dose CT scan of the lungs is recommended for people who:  Currently smoke.  Have quit within the past 15 years.  Have at least a 30-pack-year history of smoking. A pack year is smoking an average of one pack of cigarettes a day for 1 year.  Yearly screening should continue until it has been 15 years since you quit.  Yearly screening should stop if you develop a health problem that would prevent you from having lung cancer treatment.  Breast Cancer  Practice breast self-awareness. This means understanding how your breasts normally appear and feel.  It also means doing regular breast self-exams. Let your health care provider know about any changes, no matter how small.  If you are in your 20s or 30s, you should have a clinical breast exam (CBE) by a health care provider every 1-3 years as part of a regular health exam.  If you are 76 or older, have a CBE every year. Also consider having a breast X-ray (mammogram) every year.  If you have a family history of breast cancer, talk to your health care provider about genetic screening.  If you are  at high risk for breast cancer, talk to your health care provider about having an MRI and a mammogram every year.  Breast cancer gene (BRCA) assessment is recommended for women who have family members with BRCA-related cancers. BRCA-related cancers include:  Breast.  Ovarian.  Tubal.  Peritoneal cancers.  Results of the assessment will determine the need for genetic counseling and BRCA1 and BRCA2 testing. Cervical Cancer Routine pelvic examinations to screen for cervical cancer are no longer recommended for nonpregnant women who are considered low  risk for cancer of the pelvic organs (ovaries, uterus, and vagina) and who do not have symptoms. A pelvic examination may be necessary if you have symptoms including those associated with pelvic infections. Ask your health care provider if a screening pelvic exam is right for you.   The Pap test is the screening test for cervical cancer for women who are considered at risk.  If you had a hysterectomy for a problem that was not cancer or a condition that could lead to cancer, then you no longer need Pap tests.  If you are older than 65 years, and you have had normal Pap tests for the past 10 years, you no longer need to have Pap tests.  If you have had past treatment for cervical cancer or a condition that could lead to cancer, you need Pap tests and screening for cancer for at least 20 years after your treatment.  If you no longer get a Pap test, assess your risk factors if they change (such as having a new sexual partner). This can affect whether you should start being screened again.  Some women have medical problems that increase their chance of getting cervical cancer. If this is the case for you, your health care provider may recommend more frequent screening and Pap tests.  The human papillomavirus (HPV) test is another test that may be used for cervical cancer screening. The HPV test looks for the virus that can cause cell changes in the cervix. The cells collected during the Pap test can be tested for HPV.  The HPV test can be used to screen women 30 years of age and older. Getting tested for HPV can extend the interval between normal Pap tests from three to five years.  An HPV test also should be used to screen women of any age who have unclear Pap test results.  After 62 years of age, women should have HPV testing as often as Pap tests.  Colorectal Cancer  This type of cancer can be detected and often prevented.  Routine colorectal cancer screening usually begins at 62 years of  age and continues through 62 years of age.  Your health care provider may recommend screening at an earlier age if you have risk factors for colon cancer.  Your health care provider may also recommend using home test kits to check for hidden blood in the stool.  A small camera at the end of a tube can be used to examine your colon directly (sigmoidoscopy or colonoscopy). This is done to check for the earliest forms of colorectal cancer.  Routine screening usually begins at age 50.  Direct examination of the colon should be repeated every 5-10 years through 62 years of age. However, you may need to be screened more often if early forms of precancerous polyps or small growths are found. Skin Cancer  Check your skin from head to toe regularly.  Tell your health care provider about any new moles or changes in   moles, especially if there is a change in a mole's shape or color.  Also tell your health care provider if you have a mole that is larger than the size of a pencil eraser.  Always use sunscreen. Apply sunscreen liberally and repeatedly throughout the day.  Protect yourself by wearing long sleeves, pants, a wide-brimmed hat, and sunglasses whenever you are outside. HEART DISEASE, DIABETES, AND HIGH BLOOD PRESSURE   Have your blood pressure checked at least every 1-2 years. High blood pressure causes heart disease and increases the risk of stroke.  If you are between 75 years and 42 years old, ask your health care provider if you should take aspirin to prevent strokes.  Have regular diabetes screenings. This involves taking a blood sample to check your fasting blood sugar level.  If you are at a normal weight and have a low risk for diabetes, have this test once every three years after 62 years of age.  If you are overweight and have a high risk for diabetes, consider being tested at a younger age or more often. PREVENTING INFECTION  Hepatitis B  If you have a higher risk for  hepatitis B, you should be screened for this virus. You are considered at high risk for hepatitis B if:  You were born in a country where hepatitis B is common. Ask your health care provider which countries are considered high risk.  Your parents were born in a high-risk country, and you have not been immunized against hepatitis B (hepatitis B vaccine).  You have HIV or AIDS.  You use needles to inject street drugs.  You live with someone who has hepatitis B.  You have had sex with someone who has hepatitis B.  You get hemodialysis treatment.  You take certain medicines for conditions, including cancer, organ transplantation, and autoimmune conditions. Hepatitis C  Blood testing is recommended for:  Everyone born from [birth year] through 1964.  Anyone with known risk factors for hepatitis C. Sexually transmitted infections (STIs)  You should be screened for sexually transmitted infections (STIs) including gonorrhea and chlamydia if:  You are sexually active and are younger than 62 years of age.  You are older than 62 years of age and your health care provider tells you that you are at risk for this type of infection.  Your sexual activity has changed since you were last screened and you are at an increased risk for chlamydia or gonorrhea. Ask your health care provider if you are at risk.  If you do not have HIV, but are at risk, it may be recommended that you take a prescription medicine daily to prevent HIV infection. This is called pre-exposure prophylaxis (PrEP). You are considered at risk if:  You are sexually active and do not regularly use condoms or know the HIV status of your partner(s).  You take drugs by injection.  You are sexually active with a partner who has HIV. Talk with your health care provider about whether you are at high risk of being infected with HIV. If you choose to begin PrEP, you should first be tested for HIV. You should then be tested every 3 months for  as long as you are taking PrEP.  PREGNANCY   If you are premenopausal and you may become pregnant, ask your health care provider about preconception counseling.  If you may become pregnant, take 400 to 800 micrograms (mcg) of folic acid every day.  If you want to prevent pregnancy, talk to your  health care provider about birth control (contraception). OSTEOPOROSIS AND MENOPAUSE   Osteoporosis is a disease in which the bones lose minerals and strength with aging. This can result in serious bone fractures. Your risk for osteoporosis can be identified using a bone density scan.  If you are 65 years of age or older, or if you are at risk for osteoporosis and fractures, ask your health care provider if you should be screened.  Ask your health care provider whether you should take a calcium or vitamin D supplement to lower your risk for osteoporosis.  Menopause may have certain physical symptoms and risks.  Hormone replacement therapy may reduce some of these symptoms and risks. Talk to your health care provider about whether hormone replacement therapy is right for you.  HOME CARE INSTRUCTIONS   Schedule regular health, dental, and eye exams.  Stay current with your immunizations.   Do not use any tobacco products including cigarettes, chewing tobacco, or electronic cigarettes.  If you are pregnant, do not drink alcohol.  If you are breastfeeding, limit how much and how often you drink alcohol.  Limit alcohol intake to no more than 1 drink per day for nonpregnant women. One drink equals 12 ounces of beer, 5 ounces of wine, or 1 ounces of hard liquor.  Do not use street drugs.  Do not share needles.  Ask your health care provider for help if you need support or information about quitting drugs.  Tell your health care provider if you often feel depressed.  Tell your health care provider if you have ever been abused or do not feel safe at home. Document Released: 10/19/2010  Document Revised: 08/20/2013 Document Reviewed: 03/07/2013 [employer] Patient Information 2015 [employer]. This information is not intended to replace advice given to you by your health care provider. Make sure you discuss any questions you have with your health care provider.  Cardiac Diet This diet can help prevent heart disease and stroke. Many factors influence your heart health, including eating and exercise habits. Coronary risk rises a lot with abnormal blood fat (lipid) levels. Cardiac meal planning includes limiting unhealthy fats, increasing healthy fats, and making other small dietary changes. General guidelines are as follows:  Adjust calorie intake to reach and maintain desirable body weight.  Limit total fat intake to less than 30% of total calories. Saturated fat should be less than 7% of calories.  Saturated fats are found in animal products and in some vegetable products. Saturated vegetable fats are found in coconut oil, cocoa butter, palm oil, and palm kernel oil. Read labels carefully to avoid these products as much as possible. Use butter in moderation. Choose tub margarines and oils that have 2 grams of fat or less. Good cooking oils are canola and olive oils.  Practice low-fat cooking techniques. Do not fry food. Instead, broil, bake, boil, steam, grill, roast on a rack, stir-fry, or microwave it. Other fat reducing suggestions include:  Remove the skin from poultry.  Remove all visible fat from meats.  Skim the fat off stews, soups, and gravies before serving them.  Steam vegetables in water or broth instead of sauting them in fat.  Avoid foods with trans fat (or hydrogenated oils), such as commercially fried foods and commercially baked goods. Commercial shortening and deep-frying fats will contain trans fat.  Increase intake of fruits, vegetables, whole grains, and legumes to replace foods high in fat.  Increase consumption of nuts, legumes, and seeds to at  least 4 servings   weekly. One serving of a legume equals  cup, and 1 serving of nuts or seeds equals  cup.  Choose whole grains more often. Have 3 servings per day (a serving is 1 ounce [oz]).  Eat 4 to 5 servings of vegetables per day. A serving of vegetables is 1 cup of raw leafy vegetables;  cup of raw or cooked cut-up vegetables;  cup of vegetable juice.  Eat 4 to 5 servings of fruit per day. A serving of fruit is 1 medium whole fruit;  cup of dried fruit;  cup of fresh, frozen, or canned fruit;  cup of 100% fruit juice.  Increase your intake of dietary fiber to 20 to 30 grams per day. Insoluble fiber may help lower your risk of heart disease and may help curb your appetite.  Soluble fiber binds cholesterol to be removed from the blood. Foods high in soluble fiber are dried beans, citrus fruits, oats, apples, bananas, broccoli, Brussels sprouts, and eggplant.  Try to include foods fortified with plant sterols or stanols, such as yogurt, breads, juices, or margarines. Choose several fortified foods to achieve a daily intake of 2 to 3 grams of plant sterols or stanols.  Foods with omega-3 fats can help reduce your risk of heart disease. Aim to have a 3.5 oz portion of fatty fish twice per week, such as salmon, mackerel, albacore tuna, sardines, lake trout, or herring. If you wish to take a fish oil supplement, choose one that contains 1 gram of both DHA and EPA.  Limit processed meats to 2 servings (3 oz portion) weekly.  Limit the sodium in your diet to 1500 milligrams (mg) per day. If you have high blood pressure, talk to a registered dietitian about a DASH (Dietary Approaches to Stop Hypertension) eating plan.  Limit sweets and beverages with added sugar, such as soda, to no more than 5 servings per week. One serving is:   1 tablespoon sugar.  1 tablespoon jelly or jam.   cup sorbet.  1 cup lemonade.   cup regular soda. CHOOSING FOODS Starches  Allowed: Breads: All  kinds (wheat, rye, raisin, white, oatmeal, Italian, French, and English muffin bread). Low-fat rolls: English muffins, frankfurter and hamburger buns, bagels, pita bread, tortillas (not fried). Pancakes, waffles, biscuits, and muffins made with recommended oil.  Avoid: Products made with saturated or trans fats, oils, or whole milk products. Butter rolls, cheese breads, croissants. Commercial doughnuts, muffins, sweet rolls, biscuits, waffles, pancakes, store-bought mixes. Crackers  Allowed: Low-fat crackers and snacks: Animal, graham, rye, saltine (with recommended oil, no lard), oyster, and matzo crackers. Bread sticks, melba toast, rusks, flatbread, pretzels, and light popcorn.  Avoid: High-fat crackers: cheese crackers, butter crackers, and those made with coconut, palm oil, or trans fat (hydrogenated oils). Buttered popcorn. Cereals  Allowed: Hot or cold whole-grain cereals.  Avoid: Cereals containing coconut, hydrogenated vegetable fat, or animal fat. Potatoes / Pasta / Rice  Allowed: All kinds of potatoes, rice, and pasta (such as macaroni, spaghetti, and noodles).  Avoid: Pasta or rice prepared with cream sauce or high-fat cheese. Chow mein noodles, French fries. Vegetables  Allowed: All vegetables and vegetable juices.  Avoid: Fried vegetables. Vegetables in cream, butter, or high-fat cheese sauces. Limit coconut. Fruit in cream or custard. Protein  Allowed: Limit your intake of meat, seafood, and poultry to no more than 6 oz (cooked weight) per day. All lean, well-trimmed beef, veal, pork, and lamb. All chicken and turkey without skin. All fish and shellfish. Wild game:   wild duck, rabbit, pheasant, and venison. Egg whites or low-cholesterol egg substitutes may be used as desired. Meatless dishes: recipes with dried beans, peas, lentils, and tofu (soybean curd). Seeds and nuts: all seeds and most nuts.  Avoid: Prime grade and other heavily marbled and fatty meats, such as short  ribs, spare ribs, rib eye roast or steak, frankfurters, sausage, bacon, and high-fat luncheon meats, mutton. Caviar. Commercially fried fish. Domestic duck, goose, venison sausage. Organ meats: liver, gizzard, heart, chitterlings, brains, kidney, sweetbreads. Dairy  Allowed: Low-fat cheeses: nonfat or low-fat cottage cheese (1% or 2% fat), cheeses made with part skim milk, such as mozzarella, farmers, string, or ricotta. (Cheeses should be labeled no more than 2 to 6 grams fat per oz.). Skim (or 1%) milk: liquid, powdered, or evaporated. Buttermilk made with low-fat milk. Drinks made with skim or low-fat milk or cocoa. Chocolate milk or cocoa made with skim or low-fat (1%) milk. Nonfat or low-fat yogurt.  Avoid: Whole milk cheeses, including colby, cheddar, muenster, Monterey Jack, Havarti, Brie, Camembert, American, Swiss, and blue. Creamed cottage cheese, cream cheese. Whole milk and whole milk products, including buttermilk or yogurt made from whole milk, drinks made from whole milk. Condensed milk, evaporated whole milk, and 2% milk. Soups and Combination Foods  Allowed: Low-fat low-sodium soups: broth, dehydrated soups, homemade broth, soups with the fat removed, homemade cream soups made with skim or low-fat milk. Low-fat spaghetti, lasagna, chili, and Spanish rice if low-fat ingredients and low-fat cooking techniques are used.  Avoid: Cream soups made with whole milk, cream, or high-fat cheese. All other soups. Desserts and Sweets  Allowed: Sherbet, fruit ices, gelatins, meringues, and angel food cake. Homemade desserts with recommended fats, oils, and milk products. Jam, jelly, honey, marmalade, sugars, and syrups. Pure sugar candy, such as gum drops, hard candy, jelly beans, marshmallows, mints, and small amounts of dark chocolate.  Avoid: Commercially prepared cakes, pies, cookies, frosting, pudding, or mixes for these products. Desserts containing whole milk products, chocolate, coconut,  lard, palm oil, or palm kernel oil. Ice cream or ice cream drinks. Candy that contains chocolate, coconut, butter, hydrogenated fat, or unknown ingredients. Buttered syrups. Fats and Oils  Allowed: Vegetable oils: safflower, sunflower, corn, soybean, cottonseed, sesame, canola, olive, or peanut. Non-hydrogenated margarines. Salad dressing or mayonnaise: homemade or commercial, made with a recommended oil. Low or nonfat salad dressing or mayonnaise.  Limit added fats and oils to 6 to 8 tsp per day (includes fats used in cooking, baking, salads, and spreads on bread). Remember to count the "hidden fats" in foods.  Avoid: Solid fats and shortenings: butter, lard, salt pork, bacon drippings. Gravy containing meat fat, shortening, or suet. Cocoa butter, coconut. Coconut oil, palm oil, palm kernel oil, or hydrogenated oils: these ingredients are often used in bakery products, nondairy creamers, whipped toppings, candy, and commercially fried foods. Read labels carefully. Salad dressings made of unknown oils, sour cream, or cheese, such as blue cheese and Roquefort. Cream, all kinds: half-and-half, light, heavy, or whipping. Sour cream or cream cheese (even if "light" or low-fat). Nondairy cream substitutes: coffee creamers and sour cream substitutes made with palm, palm kernel, hydrogenated oils, or coconut oil. Beverages  Allowed: Coffee (regular or decaffeinated), tea. Diet carbonated beverages, mineral water. Alcohol: Check with your caregiver. Moderation is recommended.  Avoid: Whole milk, regular sodas, and juice drinks with added sugar. Condiments  Allowed: All seasonings and condiments. Cocoa powder. "Cream" sauces made with recommended ingredients.  Avoid: Carob powder made with hydrogenated   SAMPLE MENU Breakfast   cup orange juice   cup oatmeal  1 slice toast  1 tsp margarine  1 cup skim milk Lunch  [place] sandwich with 2 oz [place], 2 slices bread  Lettuce and tomato  slices  Fresh fruit  Carrot sticks  Coffee or tea Snack  Fresh fruit or low-fat crackers Dinner  3 oz lean ground beef  1 baked potato  1 tsp margarine   cup asparagus  Lettuce salad  1 tbs non-creamy dressing   cup peach slices  1 cup skim milk Document Released: 01/13/2008 Document Revised: 10/05/2011 Document Reviewed: 06/05/2013 [employer] Patient Information 2015 [place], [place]. This information is not intended to replace advice given to you by your health care provider. Make sure you discuss any questions you have with your health care provider.  Take a calcium supplement, plus [id number] units of vitamin D    It is important that you exercise regularly, at least 20 minutes 3 to 4 times per week.  If you develop chest pain or shortness of breath seek  medical attention.

## 2013-12-17 NOTE — Progress Notes (Signed)
Pre visit review using our clinic review tool, if applicable. No additional management support is needed unless otherwise documented below in the visit note. 

## 2013-12-17 NOTE — Progress Notes (Signed)
Subjective:    Patient ID: Megan Lutz, female    DOB: 1950/08/01, 63 y.o.   MRN: [id number]  HPI 63 year-old patient who is seen today for a preventive health examination.  She has a family history of cancer. . She is on five-year interval colonoscopy is due to a family history of colon cancer (father)- her mother died of esophageal cancer. No postmenopausal bleeding or other issues.  She has had a normal Pap in 2012 and 2014.  Past Medical History  Diagnosis Date  . ANEMIA 09/25/2009  . HYPERLIPIDEMIA 12/07/2007  . OSTEOPENIA 12/07/2007  . PERIMENOPAUSAL SYNDROME 12/10/2008  . Diverticular disease 2013  . Hemorrhoid   . Kidney stone   . Allergy     History   Social History  . Marital Status: Divorced    Spouse Name: N/A    Number of Children: N/A  . Years of Education: N/A   Occupational History  . Not on file.   Social History Main Topics  . Smoking status: Never Smoker   . Smokeless tobacco: Never Used  . Alcohol Use: Yes  . Drug Use: No  . Sexual Activity: Not on file   Other Topics Concern  . Not on file   Social History Narrative  . No narrative on file    Past Surgical History  Procedure Laterality Date  . Tonsillectomy    . Fracture surgery      Family History  Problem Relation Age of Onset  . Cancer Mother   . Cancer Father     Allergies  Allergen Reactions  . Codeine Phosphate Nausea And Vomiting    Current Outpatient Prescriptions on File Prior to Visit  Medication Sig Dispense Refill  . aspirin 81 MG tablet Take 81 mg by mouth daily.        . Calcium Carbonate-Vitamin D (CALTRATE 600+D) 600-400 MG-UNIT per tablet Take 1 tablet by mouth daily.        . Omega-3 Fatty Acids (FISH OIL) 1000 MG CAPS Take by mouth daily.         No current facility-administered medications on file prior to visit.    BP 127/76  Pulse 87  Temp(Src) 97.7 F (36.5 C) (Oral)  Resp 20  Ht 5\' 5"  (1.651 m)  Wt 159 lb (72.122 kg)  BMI 26.46 kg/m2  SpO2  96%       Review of Systems  Constitutional: Negative.  Negative for fever, appetite change, fatigue and unexpected weight change.  HENT: Positive for ear pain. Negative for congestion, dental problem, hearing loss, mouth sores, nosebleeds, rhinorrhea, sinus pressure, sore throat, tinnitus, trouble swallowing and voice change.   Eyes: Negative for photophobia, pain, discharge, redness and visual disturbance.  Respiratory: Negative for cough, chest tightness and shortness of breath.   Cardiovascular: Negative for chest pain, palpitations and leg swelling.  Gastrointestinal: Negative for nausea, vomiting, abdominal pain, diarrhea, constipation, blood in stool, abdominal distention and rectal pain.  Genitourinary: Negative for dysuria, urgency, frequency, hematuria, flank pain, vaginal bleeding, vaginal discharge, difficulty urinating, genital sores, vaginal pain, menstrual problem and pelvic pain.  Musculoskeletal: Positive for back pain. Negative for arthralgias, gait problem, joint swelling and neck stiffness.  Skin: Negative for rash.  Neurological: Negative for dizziness, syncope, speech difficulty, weakness, light-headedness, numbness and headaches.  Hematological: Negative for adenopathy. Does not bruise/bleed easily.  Psychiatric/Behavioral: Negative for suicidal ideas, behavioral problems, self-injury, dysphoric mood and agitation. The patient is not nervous/anxious.  Objective:   Physical Exam  Constitutional: She is oriented to person, place, and time. She appears well-developed and well-nourished. No distress.  HENT:  Head: Normocephalic and atraumatic.  Right Ear: External ear normal.  Left Ear: External ear normal.  Mouth/Throat: Oropharynx is clear and moist.  Eyes: Conjunctivae and EOM are normal.  Neck: Normal range of motion. Neck supple. No JVD present. No thyromegaly present.  Cardiovascular: Normal rate, regular rhythm, normal heart sounds and intact distal  pulses.   No murmur heard. Pulmonary/Chest: Effort normal and breath sounds normal. She has no wheezes. She has no rales.  Abdominal: Soft. Bowel sounds are normal. She exhibits no distension and no mass. There is no tenderness. There is no rebound and no guarding.  Genitourinary: Vagina normal and uterus normal. No vaginal discharge found.  Specimen obtained  Musculoskeletal: Normal range of motion. She exhibits no edema and no tenderness.  Neurological: She is alert and oriented to person, place, and time. She has normal reflexes. No cranial nerve deficit. She exhibits normal muscle tone. Coordination normal.  Skin: Skin is warm and dry. No rash noted.  Psychiatric: She has a normal mood and affect. Her behavior is normal.          Assessment & Plan:   Preventive health examination Dyslipidemia stable  Return in one year for followup

## 2013-12-19 LAB — CYTOLOGY - PAP

## 2014-10-14 ENCOUNTER — Other Ambulatory Visit: Payer: Self-pay

## 2014-10-14 DIAGNOSIS — Z1231 Encounter for screening mammogram for malignant neoplasm of breast: Secondary | ICD-10-CM

## 2014-10-28 ENCOUNTER — Ambulatory Visit
Admission: RE | Admit: 2014-10-28 | Discharge: 2014-10-28 | Disposition: A | Discharge status: Home / Self Care | Payer: BLUE CROSS/BLUE SHIELD | Source: Ambulatory Visit

## 2014-10-28 DIAGNOSIS — Z1231 Encounter for screening mammogram for malignant neoplasm of breast: Secondary | ICD-10-CM

## 2014-11-07 ENCOUNTER — Ambulatory Visit (INDEPENDENT_AMBULATORY_CARE_PROVIDER_SITE_OTHER): Payer: BLUE CROSS/BLUE SHIELD | Admitting: Internal Medicine

## 2014-11-07 ENCOUNTER — Encounter: Payer: Self-pay | Admitting: Internal Medicine

## 2014-11-07 DIAGNOSIS — M5442 Lumbago with sciatica, left side: Secondary | ICD-10-CM | POA: Diagnosis not present

## 2014-11-07 DIAGNOSIS — E785 Hyperlipidemia, unspecified: Secondary | ICD-10-CM

## 2014-11-07 MED ORDER — METHYLPREDNISOLONE ACETATE 80 MG/ML IJ SUSP
80.0000 mg | Freq: Once | INTRAMUSCULAR | Status: AC
Start: 2014-11-07 — End: 2014-11-07
  Administered 2014-11-07: 80 mg via INTRAMUSCULAR

## 2014-11-07 NOTE — Progress Notes (Signed)
Pre visit review using our clinic review tool, if applicable. No additional management support is needed unless otherwise documented below in the visit note. 

## 2014-11-07 NOTE — Progress Notes (Signed)
Subjective:    Patient ID: Megan Lutz, female    DOB: 21-Nov-1950, 64 y.o.   MRN: [id number]  HPI 64 year old patient who has a long history of intermittent left lumbar pain.  For the past 2 weeks she has had increasing the left lumbar pain associated with a burning and achiness involving primarily her left anterior thigh area.  No motor weakness.  No fever or other systemic complaints. She also notes some occasional left lower quadrant discomfort.  Past Medical History  Diagnosis Date  . ANEMIA 09/25/2009  . HYPERLIPIDEMIA 12/07/2007  . OSTEOPENIA 12/07/2007  . PERIMENOPAUSAL SYNDROME 12/10/2008  . Diverticular disease 2013  . Hemorrhoid   . Kidney stone   . Allergy     History   Social History  . Marital Status: Divorced    Spouse Name: N/A  . Number of Children: N/A  . Years of Education: N/A   Occupational History  . Not on file.   Social History Main Topics  . Smoking status: Never Smoker   . Smokeless tobacco: Never Used  . Alcohol Use: Yes  . Drug Use: No  . Sexual Activity: Not on file   Other Topics Concern  . Not on file   Social History Narrative    Past Surgical History  Procedure Laterality Date  . Tonsillectomy    . Fracture surgery      Family History  Problem Relation Age of Onset  . Cancer Mother   . Cancer Father     Allergies  Allergen Reactions  . Codeine Phosphate Nausea And Vomiting    Current Outpatient Prescriptions on File Prior to Visit  Medication Sig Dispense Refill  . aspirin 81 MG tablet Take 81 mg by mouth daily.      . pravastatin (PRAVACHOL) 40 MG tablet TAKE ONE TABLET BY MOUTH ONCE DAILY 90 tablet 3  . Calcium Carbonate-Vitamin D (CALTRATE 600+D) 600-400 MG-UNIT per tablet Take 1 tablet by mouth daily.      . Omega-3 Fatty Acids (FISH OIL) 1000 MG CAPS Take by mouth daily.       No current facility-administered medications on file prior to visit.    BP 130/80 mmHg  Pulse 75  Temp(Src) 98 F (36.7 C)  (Oral)  Resp 18  Ht 5\' 5"  (1.651 m)  Wt 152 lb (68.947 kg)  BMI 25.29 kg/m2  SpO2 95%      Review of Systems  Constitutional: Negative.   HENT: Negative for congestion, dental problem, hearing loss, rhinorrhea, sinus pressure, sore throat and tinnitus.   Eyes: Negative for pain, discharge and visual disturbance.  Respiratory: Negative for cough and shortness of breath.   Cardiovascular: Negative for chest pain, palpitations and leg swelling.  Gastrointestinal: Positive for abdominal pain. Negative for nausea, vomiting, diarrhea, constipation, blood in stool and abdominal distention.       Mild left lower quadrant discomfort for the past 2 days, intermittent  Genitourinary: Negative for dysuria, urgency, frequency, hematuria, flank pain, vaginal bleeding, vaginal discharge, difficulty urinating, vaginal pain and pelvic pain.  Musculoskeletal: Positive for back pain. Negative for joint swelling, arthralgias and gait problem.  Skin: Negative for rash.  Neurological: Negative for dizziness, syncope, speech difficulty, weakness, numbness and headaches.  Hematological: Negative for adenopathy.  Psychiatric/Behavioral: Negative for behavioral problems, dysphoric mood and agitation. The patient is not nervous/anxious.        Objective:   Physical Exam  Constitutional: She appears well-developed and well-nourished. No distress.  Musculoskeletal:  Negative straight leg test Able to walk on toes and heels without difficulty Patellar and Achilles reflexes brisk No sensory loss          Assessment & Plan:   Back pain with mild radicular symptoms.  Will treat with Depo-Medrol 80 and observe Will moderate activity level Will call if there is any new or worsening symptoms

## 2014-11-07 NOTE — Patient Instructions (Signed)
Most patients with low back pain will improve with time over the next two to 6 weeks.  Keep active but avoid any activities that cause pain.  Apply moist heat to the low back area several times daily.  Back Injury Prevention Back injuries can be extremely painful and difficult to heal. After having one back injury, you are much more likely to experience another later on. It is important to learn how to avoid injuring or re-injuring your back. The following tips can help you to prevent a back injury. PHYSICAL FITNESS  Exercise regularly and try to develop good tone in your abdominal muscles. Your abdominal muscles provide a lot of the support needed by your back.  Do aerobic exercises (walking, jogging, biking, swimming) regularly.  Do exercises that increase balance and strength (tai chi, yoga) regularly. This can decrease your risk of falling and injuring your back.  Stretch before and after exercising.  Maintain a healthy weight. The more you weigh, the more stress is placed on your back. For every pound of weight, 10 times that amount of pressure is placed on the back. DIET  Talk to your caregiver about how much calcium and vitamin D you need per day. These nutrients help to prevent weakening of the bones (osteoporosis). Osteoporosis can cause broken (fractured) bones that lead to back pain.  Include good sources of calcium in your diet, such as dairy products, green, leafy vegetables, and products with calcium added (fortified).  Include good sources of vitamin D in your diet, such as milk and foods that are fortified with vitamin D.  Consider taking a nutritional supplement or a multivitamin if needed.  Stop smoking if you smoke. POSTURE  Sit and stand up straight. Avoid leaning forward when you sit or hunching over when you stand.  Choose chairs with good low back (lumbar) support.  If you work at a desk, sit close to your work so you do not need to lean over. Keep your chin  tucked in. Keep your neck drawn back and elbows bent at a right angle. Your arms should look like the letter "L."  Sit high and close to the steering wheel when you drive. Add a lumbar support to your car seat if needed.  Avoid sitting or standing in one position for too long. Take breaks to get up, stretch, and walk around at least once every hour. Take breaks if you are driving for long periods of time.  Sleep on your side with your knees slightly bent, or sleep on your back with a pillow under your knees. Do not sleep on your stomach. LIFTING, TWISTING, AND REACHING  Avoid heavy lifting, especially repetitive lifting. If you must do heavy lifting:  Stretch before lifting.  Work slowly.  Rest between lifts.  Use carts and dollies to move objects when possible.  Make several small trips instead of carrying 1 heavy load.  Ask for help when you need it.  Ask for help when moving big, awkward objects.  Follow these steps when lifting:  Stand with your feet shoulder-width apart.  Get as close to the object as you can. Do not try to pick up heavy objects that are far from your body.  Use handles or lifting straps if they are available.  Bend at your knees. Squat down, but keep your heels off the floor.  Keep your shoulders pulled back, your chin tucked in, and your back straight.  Lift the object slowly, tightening the muscles in your legs,   abdomen, and buttocks. Keep the object as close to the center of your body as possible.  When you put a load down, use these same guidelines in reverse.  Do not:  Lift the object above your waist.  Twist at the waist while lifting or carrying a load. Move your feet if you need to turn, not your waist.  Bend over without bending at your knees.  Avoid reaching over your head, across a table, or for an object on a high surface. OTHER TIPS  Avoid wet floors and keep sidewalks clear of ice to prevent falls.  Do not sleep on a mattress  that is too soft or too hard.  Keep items that are used frequently within easy reach.  Put heavier objects on shelves at waist level and lighter objects on lower or higher shelves.  Find ways to decrease your stress, such as exercise, massage, or relaxation techniques. Stress can build up in your muscles. Tense muscles are more vulnerable to injury.  Seek treatment for depression or anxiety if needed. These conditions can increase your risk of developing back pain. SEEK MEDICAL CARE IF:  You injure your back.  You have questions about diet, exercise, or other ways to prevent back injuries. MAKE SURE YOU:  Understand these instructions.  Will watch your condition.  Will get help right away if you are not doing well or get worse. Document Released: 05/13/2004 Document Revised: 06/28/2011 Document Reviewed: 05/17/2011 [facility] Patient Information 2015 [place], [place]. This information is not intended to replace advice given to you by your health care provider. Make sure you discuss any questions you have with your health care provider. Low Back Sprain with Rehab  A sprain is an injury in which a ligament is torn. The ligaments of the lower back are vulnerable to sprains. However, they are strong and require great force to be injured. These ligaments are important for stabilizing the spinal column. Sprains are classified into three categories. Grade 1 sprains cause pain, but the tendon is not lengthened. Grade 2 sprains include a lengthened ligament, due to the ligament being stretched or partially ruptured. With grade 2 sprains there is still function, although the function may be decreased. Grade 3 sprains involve a complete tear of the tendon or muscle, and function is usually impaired. SYMPTOMS   Severe pain in the lower back.  Sometimes, a feeling of a "pop," "snap," or tear, at the time of injury.  Tenderness and sometimes swelling at the injury site.  Uncommonly, bruising  (contusion) within 48 hours of injury.  Muscle spasms in the back. CAUSES  Low back sprains occur when a force is placed on the ligaments that is greater than they can handle. Common causes of injury include:  Performing a stressful act while off-balance.  Repetitive stressful activities that involve movement of the lower back.  Direct hit (trauma) to the lower back. RISK INCREASES WITH:  Contact sports (football, wrestling).  Collisions (major skiing accidents).  Sports that require throwing or lifting (baseball, weightlifting).  Sports involving twisting of the spine (gymnastics, diving, tennis, golf).  Poor strength and flexibility.  Inadequate protection.  Previous back injury or surgery (especially fusion). PREVENTION  Wear properly fitted and padded protective equipment.  Warm up and stretch properly before activity.  Allow for adequate recovery between workouts.  Maintain physical fitness:  Strength, flexibility, and endurance.  Cardiovascular fitness.  Maintain a healthy body weight. PROGNOSIS  If treated properly, low back sprains usually heal with non-surgical treatment.  The length of time for healing depends on the severity of the injury.  RELATED COMPLICATIONS   Recurring symptoms, resulting in a chronic problem.  Chronic inflammation and pain in the low back.  Delayed healing or resolution of symptoms, especially if activity is resumed too soon.  Prolonged impairment.  Unstable or arthritic joints of the low back. TREATMENT  Treatment first involves the use of ice and medicine, to reduce pain and inflammation. The use of strengthening and stretching exercises may help reduce pain with activity. These exercises may be performed at home or with a therapist. Severe injuries may require referral to a therapist for further evaluation and treatment, such as ultrasound. Your caregiver may advise that you wear a back brace or corset, to help reduce pain and  discomfort. Often, prolonged bed rest results in greater harm then benefit. Corticosteroid injections may be recommended. However, these should be reserved for the most serious cases. It is important to avoid using your back when lifting objects. At night, sleep on your back on a firm mattress, with a pillow placed under your knees. If non-surgical treatment is unsuccessful, surgery may be needed.  MEDICATION   If pain medicine is needed, nonsteroidal anti-inflammatory medicines (aspirin and ibuprofen), or other minor pain relievers (acetaminophen), are often advised.  Do not take pain medicine for 7 days before surgery.  Prescription pain relievers may be given, if your caregiver thinks they are needed. Use only as directed and only as much as you need.  Ointments applied to the skin may be helpful.  Corticosteroid injections may be given by your caregiver. These injections should be reserved for the most serious cases, because they may only be given a certain number of times. HEAT AND COLD  Cold treatment (icing) should be applied for 10 to 15 minutes every 2 to 3 hours for inflammation and pain, and immediately after activity that aggravates your symptoms. Use ice packs or an ice massage.  Heat treatment may be used before performing stretching and strengthening activities prescribed by your caregiver, physical therapist, or athletic trainer. Use a heat pack or a warm water soak. SEEK MEDICAL CARE IF:   Symptoms get worse or do not improve in 2 to 4 weeks, despite treatment.  You develop numbness or weakness in either leg.  You lose bowel or bladder function.  Any of the following occur after surgery: fever, increased pain, swelling, redness, drainage of fluids, or bleeding in the affected area.  New, unexplained symptoms develop. (Drugs used in treatment may produce side effects.) EXERCISES  RANGE OF MOTION (ROM) AND STRETCHING EXERCISES - Low Back Sprain Most people with lower back  pain will find that their symptoms get worse with excessive bending forward (flexion) or arching at the lower back (extension). The exercises that will help resolve your symptoms will focus on the opposite motion.  Your physician, physical therapist or athletic trainer will help you determine which exercises will be most helpful to resolve your lower back pain. Do not complete any exercises without first consulting with your caregiver. Discontinue any exercises which make your symptoms worse, until you speak to your caregiver. If you have pain, numbness or tingling which travels down into your buttocks, leg or foot, the goal of the therapy is for these symptoms to move closer to your back and eventually resolve. Sometimes, these leg symptoms will get better, but your lower back pain may worsen. This is often an indication of progress in your rehabilitation. Be very alert to  any changes in your symptoms and the activities in which you participated in the 24 hours prior to the change. Sharing this information with your caregiver will allow him or her to most efficiently treat your condition. These exercises may help you when beginning to rehabilitate your injury. Your symptoms may resolve with or without further involvement from your physician, physical therapist or athletic trainer. While completing these exercises, remember:   Restoring tissue flexibility helps normal motion to return to the joints. This allows healthier, less painful movement and activity.  An effective stretch should be held for at least 30 seconds.  A stretch should never be painful. You should only feel a gentle lengthening or release in the stretched tissue. FLEXION RANGE OF MOTION AND STRETCHING EXERCISES: STRETCH - Flexion, Single Knee to Chest   Lie on a firm bed or floor with both legs extended in front of you.  Keeping one leg in contact with the floor, bring your opposite knee to your chest. Hold your leg in place by either  grabbing behind your thigh or at your knee.  Pull until you feel a gentle stretch in your low back. Hold __________ seconds.  Slowly release your grasp and repeat the exercise with the opposite side. Repeat __________ times. Complete this exercise __________ times per day.  STRETCH - Flexion, Double Knee to Chest  Lie on a firm bed or floor with both legs extended in front of you.  Keeping one leg in contact with the floor, bring your opposite knee to your chest.  Tense your stomach muscles to support your back and then lift your other knee to your chest. Hold your legs in place by either grabbing behind your thighs or at your knees.  Pull both knees toward your chest until you feel a gentle stretch in your low back. Hold __________ seconds.  Tense your stomach muscles and slowly return one leg at a time to the floor. Repeat __________ times. Complete this exercise __________ times per day.  STRETCH - Low Trunk Rotation  Lie on a firm bed or floor. Keeping your legs in front of you, bend your knees so they are both pointed toward the ceiling and your feet are flat on the floor.  Extend your arms out to the side. This will stabilize your upper body by keeping your shoulders in contact with the floor.  Gently and slowly drop both knees together to one side until you feel a gentle stretch in your low back. Hold for __________ seconds.  Tense your stomach muscles to support your lower back as you bring your knees back to the starting position. Repeat the exercise to the other side. Repeat __________ times. Complete this exercise __________ times per day  EXTENSION RANGE OF MOTION AND FLEXIBILITY EXERCISES: STRETCH - Extension, Prone on Elbows   Lie on your stomach on the floor, a bed will be too soft. Place your palms about shoulder width apart and at the height of your head.  Place your elbows under your shoulders. If this is too painful, stack pillows under your chest.  Allow your  body to relax so that your hips drop lower and make contact more completely with the floor.  Hold this position for __________ seconds.  Slowly return to lying flat on the floor. Repeat __________ times. Complete this exercise __________ times per day.  RANGE OF MOTION - Extension, Prone Press Ups  Lie on your stomach on the floor, a bed will be too soft. Place your  palms about shoulder width apart and at the height of your head.  Keeping your back as relaxed as possible, slowly straighten your elbows while keeping your hips on the floor. You may adjust the placement of your hands to maximize your comfort. As you gain motion, your hands will come more underneath your shoulders.  Hold this position __________ seconds.  Slowly return to lying flat on the floor. Repeat __________ times. Complete this exercise __________ times per day.  RANGE OF MOTION- Quadruped, Neutral Spine   Assume a hands and knees position on a firm surface. Keep your hands under your shoulders and your knees under your hips. You may place padding under your knees for comfort.  Drop your head and point your tailbone toward the ground below you. This will round out your lower back like an angry cat. Hold this position for __________ seconds.  Slowly lift your head and release your tail bone so that your back sags into a large arch, like an old horse.  Hold this position for __________ seconds.  Repeat this until you feel limber in your low back.  Now, find your "sweet spot." This will be the most comfortable position somewhere between the two previous positions. This is your neutral spine. Once you have found this position, tense your stomach muscles to support your low back.  Hold this position for __________ seconds. Repeat __________ times. Complete this exercise __________ times per day.  STRENGTHENING EXERCISES - Low Back Sprain These exercises may help you when beginning to rehabilitate your injury. These  exercises should be done near your "sweet spot." This is the neutral, low-back arch, somewhere between fully rounded and fully arched, that is your least painful position. When performed in this safe range of motion, these exercises can be used for people who have either a flexion or extension based injury. These exercises may resolve your symptoms with or without further involvement from your physician, physical therapist or athletic trainer. While completing these exercises, remember:   Muscles can gain both the endurance and the strength needed for everyday activities through controlled exercises.  Complete these exercises as instructed by your physician, physical therapist or athletic trainer. Increase the resistance and repetitions only as guided.  You may experience muscle soreness or fatigue, but the pain or discomfort you are trying to eliminate should never worsen during these exercises. If this pain does worsen, stop and make certain you are following the directions exactly. If the pain is still present after adjustments, discontinue the exercise until you can discuss the trouble with your caregiver. STRENGTHENING - Deep Abdominals, Pelvic Tilt   Lie on a firm bed or floor. Keeping your legs in front of you, bend your knees so they are both pointed toward the ceiling and your feet are flat on the floor.  Tense your lower abdominal muscles to press your low back into the floor. This motion will rotate your pelvis so that your tail bone is scooping upwards rather than pointing at your feet or into the floor. With a gentle tension and even breathing, hold this position for __________ seconds. Repeat __________ times. Complete this exercise __________ times per day.  STRENGTHENING - Abdominals, Crunches   Lie on a firm bed or floor. Keeping your legs in front of you, bend your knees so they are both pointed toward the ceiling and your feet are flat on the floor. Cross your arms over your  chest.  Slightly tip your chin down without bending your neck.  Tense your abdominals and slowly lift your trunk high enough to just clear your shoulder blades. Lifting higher can put excessive stress on the lower back and does not further strengthen your abdominal muscles.  Control your return to the starting position. Repeat __________ times. Complete this exercise __________ times per day.  STRENGTHENING - Quadruped, Opposite UE/LE Lift   Assume a hands and knees position on a firm surface. Keep your hands under your shoulders and your knees under your hips. You may place padding under your knees for comfort.  Find your neutral spine and gently tense your abdominal muscles so that you can maintain this position. Your shoulders and hips should form a rectangle that is parallel with the floor and is not twisted.  Keeping your trunk steady, lift your right hand no higher than your shoulder and then your left leg no higher than your hip. Make sure you are not holding your breath. Hold this position for __________ seconds.  Continuing to keep your abdominal muscles tense and your back steady, slowly return to your starting position. Repeat with the opposite arm and leg. Repeat __________ times. Complete this exercise __________ times per day.  STRENGTHENING - Abdominals and Quadriceps, Straight Leg Raise   Lie on a firm bed or floor with both legs extended in front of you.  Keeping one leg in contact with the floor, bend the other knee so that your foot can rest flat on the floor.  Find your neutral spine, and tense your abdominal muscles to maintain your spinal position throughout the exercise.  Slowly lift your straight leg off the floor about 6 inches for a count of 15, making sure to not hold your breath.  Still keeping your neutral spine, slowly lower your leg all the way to the floor. Repeat this exercise with each leg __________ times. Complete this exercise __________ times per  day. POSTURE AND BODY MECHANICS CONSIDERATIONS - Low Back Sprain Keeping correct posture when sitting, standing or completing your activities will reduce the stress put on different body tissues, allowing injured tissues a chance to heal and limiting painful experiences. The following are general guidelines for improved posture. Your physician or physical therapist will provide you with any instructions specific to your needs. While reading these guidelines, remember:  The exercises prescribed by your provider will help you have the flexibility and strength to maintain correct postures.  The correct posture provides the best environment for your joints to work. All of your joints have less wear and tear when properly supported by a spine with good posture. This means you will experience a healthier, less painful body.  Correct posture must be practiced with all of your activities, especially prolonged sitting and standing. Correct posture is as important when doing repetitive low-stress activities (typing) as it is when doing a single heavy-load activity (lifting). RESTING POSITIONS Consider which positions are most painful for you when choosing a resting position. If you have pain with flexion-based activities (sitting, bending, stooping, squatting), choose a position that allows you to rest in a less flexed posture. You would want to avoid curling into a fetal position on your side. If your pain worsens with extension-based activities (prolonged standing, working overhead), avoid resting in an extended position such as sleeping on your stomach. Most people will find more comfort when they rest with their spine in a more neutral position, neither too rounded nor too arched. Lying on a non-sagging bed on your side with a pillow between your knees,  or on your back with a pillow under your knees will often provide some relief. Keep in mind, being in any one position for a prolonged period of time, no matter  how correct your posture, can still lead to stiffness. PROPER SITTING POSTURE In order to minimize stress and discomfort on your spine, you must sit with correct posture. Sitting with good posture should be effortless for a healthy body. Returning to good posture is a gradual process. Many people can work toward this most comfortably by using various supports until they have the flexibility and strength to maintain this posture on their own. When sitting with proper posture, your ears will fall over your shoulders and your shoulders will fall over your hips. You should use the back of the chair to support your upper back. Your lower back will be in a neutral position, just slightly arched. You may place a small pillow or folded towel at the base of your lower back for  support.  When working at a desk, create an environment that supports good, upright posture. Without extra support, muscles tire, which leads to excessive strain on joints and other tissues. Keep these recommendations in mind: CHAIR:  A chair should be able to slide under your desk when your back makes contact with the back of the chair. This allows you to work closely.  The chair's height should allow your eyes to be level with the upper part of your monitor and your hands to be slightly lower than your elbows. BODY POSITION  Your feet should make contact with the floor. If this is not possible, use a foot rest.  Keep your ears over your shoulders. This will reduce stress on your neck and low back. INCORRECT SITTING POSTURES  If you are feeling tired and unable to assume a healthy sitting posture, do not slouch or slump. This puts excessive strain on your back tissues, causing more damage and pain. Healthier options include:  Using more support, like a lumbar pillow.  Switching tasks to something that requires you to be upright or walking.  Talking a brief walk.  Lying down to rest in a neutral-spine position. PROLONGED  STANDING WHILE SLIGHTLY LEANING FORWARD  When completing a task that requires you to lean forward while standing in one place for a long time, place either foot up on a stationary 2-4 inch high object to help maintain the best posture. When both feet are on the ground, the lower back tends to lose its slight inward curve. If this curve flattens (or becomes too large), then the back and your other joints will experience too much stress, tire more quickly, and can cause pain. CORRECT STANDING POSTURES Proper standing posture should be assumed with all daily activities, even if they only take a few moments, like when brushing your teeth. As in sitting, your ears should fall over your shoulders and your shoulders should fall over your hips. You should keep a slight tension in your abdominal muscles to brace your spine. Your tailbone should point down to the ground, not behind your body, resulting in an over-extended swayback posture.  INCORRECT STANDING POSTURES  Common incorrect standing postures include a forward head, locked knees and/or an excessive swayback. WALKING Walk with an upright posture. Your ears, shoulders and hips should all line-up. PROLONGED ACTIVITY IN A FLEXED POSITION When completing a task that requires you to bend forward at your waist or lean over a low surface, try to find a way to stabilize 3  out of 4 of your limbs. You can place a hand or elbow on your thigh or rest a knee on the surface you are reaching across. This will provide you more stability, so that your muscles do not tire as quickly. By keeping your knees relaxed, or slightly bent, you will also reduce stress across your lower back. CORRECT LIFTING TECHNIQUES DO :  Assume a wide stance. This will provide you more stability and the opportunity to get as close as possible to the object which you are lifting.  Tense your abdominals to brace your spine. Bend at the knees and hips. Keeping your back locked in a  neutral-spine position, lift using your leg muscles. Lift with your legs, keeping your back straight.  Test the weight of unknown objects before attempting to lift them.  Try to keep your elbows locked down at your sides in order get the best strength from your shoulders when carrying an object.  Always ask for help when lifting heavy or awkward objects. INCORRECT LIFTING TECHNIQUES DO NOT:   Lock your knees when lifting, even if it is a small object.  Bend and twist. Pivot at your feet or move your feet when needing to change directions.  Assume that you can safely pick up even a paperclip without proper posture. Document Released: 04/05/2005 Document Revised: 06/28/2011 Document Reviewed: 07/18/2008 [facility] Patient Information 2015 [place], [place]. This information is not intended to replace advice given to you by your health care provider. Make sure you discuss any questions you have with your health care provider.

## 2014-12-13 ENCOUNTER — Other Ambulatory Visit (INDEPENDENT_AMBULATORY_CARE_PROVIDER_SITE_OTHER): Payer: BLUE CROSS/BLUE SHIELD

## 2014-12-13 DIAGNOSIS — Z0000 Encounter for general adult medical examination without abnormal findings: Secondary | ICD-10-CM | POA: Diagnosis not present

## 2014-12-13 LAB — BASIC METABOLIC PANEL
BUN: 16 mg/dL (ref 6–23)
CALCIUM: 9.2 mg/dL (ref 8.4–10.5)
CO2: 28 mEq/L (ref 19–32)
Chloride: 104 mEq/L (ref 96–112)
Creatinine, Ser: 0.95 mg/dL (ref 0.40–1.20)
GFR: 63.14 mL/min (ref >60.00)
GLUCOSE: 94 mg/dL (ref 70–99)
Potassium: 4 mEq/L (ref 3.5–5.1)
SODIUM: 140 meq/L (ref 135–145)

## 2014-12-13 LAB — CBC WITH DIFFERENTIAL/PLATELET
BASOS ABS: 0 10*3/uL (ref 0.0–0.1)
Basophils Relative: 0.5 % (ref 0.0–3.0)
EOS ABS: 0.1 10*3/uL (ref 0.0–0.7)
Eosinophils Relative: 1.3 % (ref 0.0–5.0)
HCT: 40.7 % (ref 36.0–46.0)
Hemoglobin: 13.8 g/dL (ref 12.0–15.0)
LYMPHS ABS: 1.9 10*3/uL (ref 0.7–4.0)
Lymphocytes Relative: 23.3 % (ref 12.0–46.0)
MCHC: 33.9 g/dL (ref 30.0–36.0)
MCV: 91.1 fl (ref 78.0–100.0)
MONOS PCT: 7.8 % (ref 3.0–12.0)
Monocytes Absolute: 0.6 10*3/uL (ref 0.1–1.0)
NEUTROS PCT: 67.1 % (ref 43.0–77.0)
Neutro Abs: 5.4 10*3/uL (ref 1.4–7.7)
Platelets: 298 10*3/uL (ref 150.0–400.0)
RBC: 4.47 Mil/uL (ref 3.87–5.11)
RDW: 12.9 % (ref 11.5–15.5)
WBC: 8.1 10*3/uL (ref 4.0–10.5)

## 2014-12-13 LAB — TSH: TSH: 3.73 u[IU]/mL (ref 0.35–4.50)

## 2014-12-13 LAB — LIPID PANEL
CHOL/HDL RATIO: 3
CHOLESTEROL: 207 mg/dL — AB (ref 0–200)
HDL: 71.3 mg/dL (ref >39.00)
LDL CALC: 111 mg/dL — AB (ref 0–99)
NonHDL: 135.45
TRIGLYCERIDES: 123 mg/dL (ref 0.0–149.0)
VLDL: 24.6 mg/dL (ref 0.0–40.0)

## 2014-12-13 LAB — POCT URINALYSIS DIPSTICK
BILIRUBIN UA: NEGATIVE
GLUCOSE UA: NEGATIVE
Ketones, UA: NEGATIVE
LEUKOCYTES UA: NEGATIVE
NITRITE UA: NEGATIVE
Protein, UA: NEGATIVE
Spec Grav, UA: 1.02
Urobilinogen, UA: 0.2
pH, UA: 6.5

## 2014-12-13 LAB — HEPATIC FUNCTION PANEL
ALBUMIN: 4 g/dL (ref 3.5–5.2)
ALK PHOS: 59 U/L (ref 39–117)
ALT: 18 U/L (ref 0–35)
AST: 18 U/L (ref 0–37)
BILIRUBIN DIRECT: 0.1 mg/dL (ref 0.0–0.3)
BILIRUBIN TOTAL: 0.5 mg/dL (ref 0.2–1.2)
Total Protein: 7.2 g/dL (ref 6.0–8.3)

## 2014-12-20 ENCOUNTER — Encounter: Payer: BLUE CROSS/BLUE SHIELD | Admitting: Internal Medicine

## 2014-12-30 ENCOUNTER — Other Ambulatory Visit (HOSPITAL_COMMUNITY)
Admission: RE | Admit: 2014-12-30 | Discharge: 2014-12-30 | Disposition: A | Discharge status: Home / Self Care | Payer: BLUE CROSS/BLUE SHIELD | Source: Ambulatory Visit | Attending: Internal Medicine | Admitting: Internal Medicine

## 2014-12-30 ENCOUNTER — Ambulatory Visit (INDEPENDENT_AMBULATORY_CARE_PROVIDER_SITE_OTHER): Payer: BLUE CROSS/BLUE SHIELD | Admitting: Internal Medicine

## 2014-12-30 ENCOUNTER — Encounter: Payer: Self-pay | Admitting: Internal Medicine

## 2014-12-30 VITALS — BP 100/60 | HR 67 | Temp 97.9°F | Ht 64.37 in | Wt 149.0 lb

## 2014-12-30 DIAGNOSIS — M899 Disorder of bone, unspecified: Secondary | ICD-10-CM

## 2014-12-30 DIAGNOSIS — E785 Hyperlipidemia, unspecified: Secondary | ICD-10-CM

## 2014-12-30 DIAGNOSIS — Z0000 Encounter for general adult medical examination without abnormal findings: Secondary | ICD-10-CM | POA: Diagnosis not present

## 2014-12-30 DIAGNOSIS — Z23 Encounter for immunization: Secondary | ICD-10-CM | POA: Diagnosis not present

## 2014-12-30 DIAGNOSIS — D649 Anemia, unspecified: Secondary | ICD-10-CM

## 2014-12-30 DIAGNOSIS — M949 Disorder of cartilage, unspecified: Secondary | ICD-10-CM

## 2014-12-30 DIAGNOSIS — Z01419 Encounter for gynecological examination (general) (routine) without abnormal findings: Secondary | ICD-10-CM

## 2014-12-30 NOTE — Patient Instructions (Signed)

## 2014-12-30 NOTE — Progress Notes (Signed)
Pre visit review using our clinic review tool, if applicable. No additional management support is needed unless otherwise documented below in the visit note. 

## 2014-12-30 NOTE — Progress Notes (Signed)
Subjective:    Patient ID: Megan Lutz, female    DOB: 28-Jul-1950, 64 y.o.   MRN: [id number]  HPI 64 year-old patient who is seen today for a preventive health examination.   She has a family history of cancer. . She is on five-year interval colonoscopy is due to a family history of colon cancer (father)- her mother died of esophageal cancer. No postmenopausal bleeding or other issues.  She has had a normal Pap in 2012 and 2014 and again in 2015.  Guidelines again discussed.  She wishes to have annual Paps for her peace of mind  Wt Readings from Last 3 Encounters:  12/30/14 149 lb (63.586 kg)  11/07/14 152 lb (68.947 kg)  12/17/13 159 lb (72.122 kg)    Past Medical History  Diagnosis Date  . ANEMIA 09/25/2009  . HYPERLIPIDEMIA 12/07/2007  . OSTEOPENIA 12/07/2007  . PERIMENOPAUSAL SYNDROME 12/10/2008  . Diverticular disease 2013  . Hemorrhoid   . Kidney stone   . Allergy     Social History   Social History  . Marital Status: Divorced    Spouse Name: N/A  . Number of Children: N/A  . Years of Education: N/A   Occupational History  . Not on file.   Social History Main Topics  . Smoking status: Never Smoker   . Smokeless tobacco: Never Used  . Alcohol Use: Yes  . Drug Use: No  . Sexual Activity: Not on file   Other Topics Concern  . Not on file   Social History Narrative    Past Surgical History  Procedure Laterality Date  . Tonsillectomy    . Fracture surgery      Family History  Problem Relation Age of Onset  . Cancer Mother   . Cancer Father     Allergies  Allergen Reactions  . Codeine Phosphate Nausea And Vomiting    Current Outpatient Prescriptions on File Prior to Visit  Medication Sig Dispense Refill  . aspirin 81 MG tablet Take 81 mg by mouth daily.      . Calcium Carbonate-Vitamin D (CALTRATE 600+D) 600-400 MG-UNIT per tablet Take 1 tablet by mouth daily.      . Omega-3 Fatty Acids (FISH OIL) 1000 MG CAPS Take by mouth daily.      .  pravastatin (PRAVACHOL) 40 MG tablet TAKE ONE TABLET BY MOUTH ONCE DAILY 90 tablet 3   No current facility-administered medications on file prior to visit.    BP 100/60 mmHg  Pulse 67  Temp(Src) 97.9 F (36.6 C) (Oral)  Ht 5' 4.37" (1.635 m)  Wt 149 lb (67.586 kg)  BMI 25.28 kg/m2  SpO2 97%       Review of Systems  Constitutional: Negative.  Negative for fever, appetite change, fatigue and unexpected weight change.  HENT: Positive for ear pain. Negative for congestion, dental problem, hearing loss, mouth sores, nosebleeds, rhinorrhea, sinus pressure, sore throat, tinnitus, trouble swallowing and voice change.   Eyes: Negative for photophobia, pain, discharge, redness and visual disturbance.  Respiratory: Negative for cough, chest tightness and shortness of breath.   Cardiovascular: Negative for chest pain, palpitations and leg swelling.  Gastrointestinal: Negative for nausea, vomiting, abdominal pain, diarrhea, constipation, blood in stool, abdominal distention and rectal pain.  Genitourinary: Negative for dysuria, urgency, frequency, hematuria, flank pain, vaginal bleeding, vaginal discharge, difficulty urinating, genital sores, vaginal pain, menstrual problem and pelvic pain.  Musculoskeletal: Positive for back pain. Negative for joint swelling, arthralgias, gait problem and neck stiffness.  Skin: Negative for rash.  Neurological: Negative for dizziness, syncope, speech difficulty, weakness, light-headedness, numbness and headaches.  Hematological: Negative for adenopathy. Does not bruise/bleed easily.  Psychiatric/Behavioral: Negative for suicidal ideas, behavioral problems, self-injury, dysphoric mood and agitation. The patient is not nervous/anxious.        Objective:   Physical Exam  Constitutional: She is oriented to person, place, and time. She appears well-developed and well-nourished. No distress.  HENT:  Head: Normocephalic and atraumatic.  Right Ear: External ear  normal.  Left Ear: External ear normal.  Mouth/Throat: Oropharynx is clear and moist.  Eyes: Conjunctivae and EOM are normal.  Neck: Normal range of motion. Neck supple. No JVD present. No thyromegaly present.  Cardiovascular: Normal rate, regular rhythm, normal heart sounds and intact distal pulses.   No murmur heard. Pulmonary/Chest: Effort normal and breath sounds normal. She has no wheezes. She has no rales.  Abdominal: Soft. Bowel sounds are normal. She exhibits no distension and no mass. There is no tenderness. There is no rebound and no guarding.  Genitourinary: Vagina normal and uterus normal. No vaginal discharge found.  Specimen obtained  Musculoskeletal: Normal range of motion. She exhibits no edema or tenderness.  Neurological: She is alert and oriented to person, place, and time. She has normal reflexes. No cranial nerve deficit. She exhibits normal muscle tone. Coordination normal.  Skin: Skin is warm and dry. No rash noted.  Psychiatric: She has a normal mood and affect. Her behavior is normal.          Assessment & Plan:   Preventive health examination Dyslipidemia stable  Return in one year for followup

## 2015-01-01 LAB — CYTOLOGY - PAP

## 2015-02-05 ENCOUNTER — Other Ambulatory Visit: Payer: Self-pay | Admitting: Internal Medicine

## 2015-08-26 ENCOUNTER — Other Ambulatory Visit: Payer: Self-pay | Admitting: Internal Medicine

## 2016-01-19 ENCOUNTER — Other Ambulatory Visit: Payer: Self-pay | Admitting: Internal Medicine

## 2016-01-19 DIAGNOSIS — Z1231 Encounter for screening mammogram for malignant neoplasm of breast: Secondary | ICD-10-CM

## 2016-02-02 ENCOUNTER — Ambulatory Visit
Admission: RE | Admit: 2016-02-02 | Discharge: 2016-02-02 | Disposition: A | Discharge status: Home / Self Care | Payer: BLUE CROSS/BLUE SHIELD | Source: Ambulatory Visit | Attending: Internal Medicine | Admitting: Internal Medicine

## 2016-02-02 DIAGNOSIS — Z1231 Encounter for screening mammogram for malignant neoplasm of breast: Secondary | ICD-10-CM | POA: Diagnosis not present

## 2016-02-05 ENCOUNTER — Other Ambulatory Visit (INDEPENDENT_AMBULATORY_CARE_PROVIDER_SITE_OTHER): Payer: BLUE CROSS/BLUE SHIELD

## 2016-02-05 DIAGNOSIS — Z0000 Encounter for general adult medical examination without abnormal findings: Secondary | ICD-10-CM

## 2016-02-05 LAB — BASIC METABOLIC PANEL
BUN: 18 mg/dL (ref 6–23)
CHLORIDE: 107 meq/L (ref 96–112)
CO2: 27 meq/L (ref 19–32)
CREATININE: 0.91 mg/dL (ref 0.40–1.20)
Calcium: 9.2 mg/dL (ref 8.4–10.5)
GFR: 66.11 mL/min (ref >60.00)
GLUCOSE: 95 mg/dL (ref 70–99)
Potassium: 4.2 mEq/L (ref 3.5–5.1)
Sodium: 142 mEq/L (ref 135–145)

## 2016-02-05 LAB — CBC WITH DIFFERENTIAL/PLATELET
BASOS PCT: 0.6 % (ref 0.0–3.0)
Basophils Absolute: 0 10*3/uL (ref 0.0–0.1)
EOS ABS: 0.1 10*3/uL (ref 0.0–0.7)
Eosinophils Relative: 2.2 % (ref 0.0–5.0)
HCT: 39.8 % (ref 36.0–46.0)
Hemoglobin: 13.4 g/dL (ref 12.0–15.0)
Lymphocytes Relative: 29.2 % (ref 12.0–46.0)
Lymphs Abs: 1.7 10*3/uL (ref 0.7–4.0)
MCHC: 33.8 g/dL (ref 30.0–36.0)
MCV: 90.1 fl (ref 78.0–100.0)
MONO ABS: 0.6 10*3/uL (ref 0.1–1.0)
Monocytes Relative: 9.9 % (ref 3.0–12.0)
NEUTROS ABS: 3.4 10*3/uL (ref 1.4–7.7)
Neutrophils Relative %: 58.1 % (ref 43.0–77.0)
PLATELETS: 269 10*3/uL (ref 150.0–400.0)
RBC: 4.42 Mil/uL (ref 3.87–5.11)
RDW: 12.7 % (ref 11.5–15.5)
WBC: 5.8 10*3/uL (ref 4.0–10.5)

## 2016-02-05 LAB — TSH: TSH: 3.91 u[IU]/mL (ref 0.35–4.50)

## 2016-02-05 LAB — POC URINALSYSI DIPSTICK (AUTOMATED)
Bilirubin, UA: NEGATIVE
Blood, UA: NEGATIVE
GLUCOSE UA: NEGATIVE
Ketones, UA: NEGATIVE
LEUKOCYTES UA: NEGATIVE
Nitrite, UA: NEGATIVE
Protein, UA: NEGATIVE
SPEC GRAV UA: 1.015
UROBILINOGEN UA: 0.2
pH, UA: 5.5

## 2016-02-05 LAB — HEPATIC FUNCTION PANEL
ALT: 17 U/L (ref 0–35)
AST: 18 U/L (ref 0–37)
Albumin: 4.1 g/dL (ref 3.5–5.2)
Alkaline Phosphatase: 62 U/L (ref 39–117)
BILIRUBIN DIRECT: 0.1 mg/dL (ref 0.0–0.3)
BILIRUBIN TOTAL: 0.4 mg/dL (ref 0.2–1.2)
TOTAL PROTEIN: 7 g/dL (ref 6.0–8.3)

## 2016-02-05 LAB — LIPID PANEL
CHOL/HDL RATIO: 3
CHOLESTEROL: 162 mg/dL (ref 0–200)
HDL: 58.7 mg/dL (ref >39.00)
LDL CALC: 87 mg/dL (ref 0–99)
NonHDL: 102.82
TRIGLYCERIDES: 78 mg/dL (ref 0.0–149.0)
VLDL: 15.6 mg/dL (ref 0.0–40.0)

## 2016-02-06 ENCOUNTER — Encounter: Payer: Self-pay | Admitting: Internal Medicine

## 2016-02-06 ENCOUNTER — Other Ambulatory Visit (HOSPITAL_COMMUNITY)
Admission: RE | Admit: 2016-02-06 | Discharge: 2016-02-06 | Disposition: A | Discharge status: Home / Self Care | Payer: BLUE CROSS/BLUE SHIELD | Source: Ambulatory Visit | Attending: Internal Medicine | Admitting: Internal Medicine

## 2016-02-06 ENCOUNTER — Ambulatory Visit (INDEPENDENT_AMBULATORY_CARE_PROVIDER_SITE_OTHER): Payer: BLUE CROSS/BLUE SHIELD | Admitting: Internal Medicine

## 2016-02-06 VITALS — BP 102/70 | HR 72 | Temp 98.4°F | Resp 18 | Ht 64.0 in | Wt 149.5 lb

## 2016-02-06 DIAGNOSIS — Z0000 Encounter for general adult medical examination without abnormal findings: Secondary | ICD-10-CM | POA: Diagnosis not present

## 2016-02-06 DIAGNOSIS — Z124 Encounter for screening for malignant neoplasm of cervix: Secondary | ICD-10-CM | POA: Diagnosis not present

## 2016-02-06 DIAGNOSIS — Z01419 Encounter for gynecological examination (general) (routine) without abnormal findings: Secondary | ICD-10-CM | POA: Insufficient documentation

## 2016-02-06 DIAGNOSIS — Z23 Encounter for immunization: Secondary | ICD-10-CM

## 2016-02-06 NOTE — Patient Instructions (Addendum)
It is important that you exercise regularly, at least 20 minutes 3 to 4 times per week.  If you develop chest pain or shortness of breath seek  medical attention.  Menopause is a normal process in which your reproductive ability comes to an end. This process happens gradually over a span of months to years, usually between the ages of 97 and 76. Menopause is complete when you have missed 12 consecutive menstrual periods. It is important to talk with your health care provider about some of the most common conditions that affect postmenopausal women, such as heart disease, cancer, and bone loss (osteoporosis). Adopting a healthy lifestyle and getting preventive care can help to promote your health and wellness. Those actions can also lower your chances of developing some of these common conditions. WHAT SHOULD I KNOW ABOUT MENOPAUSE? During menopause, you may experience a number of symptoms, such as:  Moderate-to-severe hot flashes.  Night sweats.  Decrease in sex drive.  Mood swings.  Headaches.  Tiredness.  Irritability.  Memory problems.  Insomnia. Choosing to treat or not to treat menopausal changes is an individual decision that you make with your health care provider. WHAT SHOULD I KNOW ABOUT HORMONE REPLACEMENT THERAPY AND SUPPLEMENTS? Hormone therapy products are effective for treating symptoms that are associated with menopause, such as hot flashes and night sweats. Hormone replacement carries certain risks, especially as you become older. If you are thinking about using estrogen or estrogen with progestin treatments, discuss the benefits and risks with your health care provider. WHAT SHOULD I KNOW ABOUT HEART DISEASE AND STROKE? Heart disease, heart attack, and stroke become more likely as you age. This may be due, in part, to the hormonal changes that your body experiences during menopause. These can affect how your body processes dietary fats, triglycerides, and cholesterol.  Heart attack and stroke are both medical emergencies. There are many things that you can do to help prevent heart disease and stroke:  Have your blood pressure checked at least every 1-2 years. High blood pressure causes heart disease and increases the risk of stroke.  If you are 66-33 years old, ask your health care provider if you should take aspirin to prevent a heart attack or a stroke.  Do not use any tobacco products, including cigarettes, chewing tobacco, or electronic cigarettes. If you need help quitting, ask your health care provider.  It is important to eat a healthy diet and maintain a healthy weight.  Be sure to include plenty of vegetables, fruits, low-fat dairy products, and lean protein.  Avoid eating foods that are high in solid fats, added sugars, or salt (sodium).  Get regular exercise. This is one of the most important things that you can do for your health.  Try to exercise for at least 150 minutes each week. The type of exercise that you do should increase your heart rate and make you sweat. This is known as moderate-intensity exercise.  Try to do strengthening exercises at least twice each week. Do these in addition to the moderate-intensity exercise.  Know your numbers.Ask your health care provider to check your cholesterol and your blood glucose. Continue to have your blood tested as directed by your health care provider. WHAT SHOULD I KNOW ABOUT CANCER SCREENING? There are several types of cancer. Take the following steps to reduce your risk and to catch any cancer development as early as possible. Breast Cancer  Practice breast self-awareness.  This means understanding how your breasts normally appear and feel.  It also means doing regular breast self-exams. Let your health care provider know about any changes, no matter how small.  If you are 63 or older, have a clinician do a breast exam (clinical breast exam or CBE) every year. Depending on your age,  family history, and medical history, it may be recommended that you also have a yearly breast X-ray (mammogram).  If you have a family history of breast cancer, talk with your health care provider about genetic screening.  If you are at high risk for breast cancer, talk with your health care provider about having an MRI and a mammogram every year.  Breast cancer (BRCA) gene test is recommended for women who have family members with BRCA-related cancers. Results of the assessment will determine the need for genetic counseling and BRCA1 and for BRCA2 testing. BRCA-related cancers include these types:  Breast. This occurs in males or females.  Ovarian.  Tubal. This may also be called fallopian tube cancer.  Cancer of the abdominal or pelvic lining (peritoneal cancer).  Prostate.  Pancreatic. Cervical, Uterine, and Ovarian Cancer Your health care provider may recommend that you be screened regularly for cancer of the pelvic organs. These include your ovaries, uterus, and vagina. This screening involves a pelvic exam, which includes checking for microscopic changes to the surface of your cervix (Pap test).  For women ages 21-65, health care providers may recommend a pelvic exam and a Pap test every three years. For women ages 66-65, they may recommend the Pap test and pelvic exam, combined with testing for human papilloma virus (HPV), every five years. Some types of HPV increase your risk of cervical cancer. Testing for HPV may also be done on women of any age who have unclear Pap test results.  Other health care providers may not recommend any screening for nonpregnant women who are considered low risk for pelvic cancer and have no symptoms. Ask your health care provider if a screening pelvic exam is right for you.  If you have had past treatment for cervical cancer or a condition that could lead to cancer, you need Pap tests and screening for cancer for at least 20 years after your treatment.  If Pap tests have been discontinued for you, your risk factors (such as having a new sexual partner) need to be reassessed to determine if you should start having screenings again. Some women have medical problems that increase the chance of getting cervical cancer. In these cases, your health care provider may recommend that you have screening and Pap tests more often.  If you have a family history of uterine cancer or ovarian cancer, talk with your health care provider about genetic screening.  If you have vaginal bleeding after reaching menopause, tell your health care provider.  There are currently no reliable tests available to screen for ovarian cancer. Lung Cancer Lung cancer screening is recommended for adults 25-50 years old who are at high risk for lung cancer because of a history of smoking. A yearly low-dose CT scan of the lungs is recommended if you:  Currently smoke.  Have a history of at least 30 pack-years of smoking and you currently smoke or have quit within the past 15 years. A pack-year is smoking an average of one pack of cigarettes per day for one year. Yearly screening should:  Continue until it has been 15 years since you quit.  Stop if you develop a health problem that would prevent you from having lung cancer treatment. Colorectal Cancer  This type of cancer can be detected and can often be prevented.  Routine colorectal cancer screening usually begins at age 58 and continues through age 31.  If you have risk factors for colon cancer, your health care provider may recommend that you be screened at an earlier age.  If you have a family history of colorectal cancer, talk with your health care provider about genetic screening.  Your health care provider may also recommend using home test kits to check for hidden blood in your stool.  A small camera at the end of a tube can be used to examine your colon directly (sigmoidoscopy or colonoscopy). This is done to check  for the earliest forms of colorectal cancer.  Direct examination of the colon should be repeated every 5-10 years until age 35. However, if early forms of precancerous polyps or small growths are found or if you have a family history or genetic risk for colorectal cancer, you may need to be screened more often. Skin Cancer  Check your skin from head to toe regularly.  Monitor any moles. Be sure to tell your health care provider:  About any new moles or changes in moles, especially if there is a change in a mole's shape or color.  If you have a mole that is larger than the size of a pencil eraser.  If any of your family members has a history of skin cancer, especially at a young age, talk with your health care provider about genetic screening.  Always use sunscreen. Apply sunscreen liberally and repeatedly throughout the day.  Whenever you are outside, protect yourself by wearing long sleeves, pants, a wide-brimmed hat, and sunglasses. WHAT SHOULD I KNOW ABOUT OSTEOPOROSIS? Osteoporosis is a condition in which bone destruction happens more quickly than new bone creation. After menopause, you may be at an increased risk for osteoporosis. To help prevent osteoporosis or the bone fractures that can happen because of osteoporosis, the following is recommended:  If you are 23-71 years old, get at least 1,000 mg of calcium and at least 600 mg of vitamin D per day.  If you are older than age 78 but younger than age 66, get at least 1,200 mg of calcium and at least 600 mg of vitamin D per day.  If you are older than age 68, get at least 1,200 mg of calcium and at least 800 mg of vitamin D per day. Smoking and excessive alcohol intake increase the risk of osteoporosis. Eat foods that are rich in calcium and vitamin D, and do weight-bearing exercises several times each week as directed by your health care provider. WHAT SHOULD I KNOW ABOUT HOW MENOPAUSE AFFECTS [place]? Depression may occur  at any age, but it is more common as you become older. Common symptoms of depression include:  Low or sad mood.  Changes in sleep patterns.  Changes in appetite or eating patterns.  Feeling an overall lack of motivation or enjoyment of activities that you previously enjoyed.  Frequent crying spells. Talk with your health care provider if you think that you are experiencing depression. WHAT SHOULD I KNOW ABOUT IMMUNIZATIONS? It is important that you get and maintain your immunizations. These include:  Tetanus, diphtheria, and pertussis (Tdap) booster vaccine.  Influenza every year before the flu season begins.  Pneumonia vaccine.  Shingles vaccine. Your health care provider may also recommend other immunizations.   This information is not intended to replace advice given to you by your health care  provider. Make sure you discuss any questions you have with your health care provider.   Take a calcium supplement, plus [phone] units of vitamin D Please repeat your bone density test at the time of your next mammogram  Colonoscopy 6 months

## 2016-02-06 NOTE — Progress Notes (Signed)
Subjective:    Patient ID: Megan Lutz, female    DOB: Oct 22, 1950, 65 y.o.   MRN: [id number]  HPI 65 year-old patient who is seen today for a preventive health examination.   She has a family history of cancer. . She is on five-year interval colonoscopy is due to a family history of colon cancer (father)- her mother died of esophageal cancer. No postmenopausal bleeding or other issues.  She has had a normal Pap in 2012 and 2014 and again in 2015.  Guidelines again discussed.  She wishes to have annual Paps for her peace of mind  Wt Readings from Last 3 Encounters:  02/06/16 149 lb 8 oz (67.8 kg)  12/30/14 149 lb (67.6 kg)  11/07/14 152 lb (68.9 kg)    Past Medical History:  Diagnosis Date  . Allergy   . ANEMIA 09/25/2009  . Diverticular disease 2013  . Hemorrhoid   . HYPERLIPIDEMIA 12/07/2007  . Kidney stone   . OSTEOPENIA 12/07/2007  . PERIMENOPAUSAL SYNDROME 12/10/2008    Social History   Social History  . Marital status: Divorced    Spouse name: N/A  . Number of children: N/A  . Years of education: N/A   Occupational History  . Not on file.   Social History Main Topics  . Smoking status: Never Smoker  . Smokeless tobacco: Never Used  . Alcohol use Yes  . Drug use: No  . Sexual activity: Not on file   Other Topics Concern  . Not on file   Social History Narrative  . No narrative on file    Past Surgical History:  Procedure Laterality Date  . FRACTURE SURGERY    . TONSILLECTOMY      Family History  Problem Relation Age of Onset  . Cancer Mother   . Cancer Father     Allergies  Allergen Reactions  . Codeine Phosphate Nausea And Vomiting    Current Outpatient Prescriptions on File Prior to Visit  Medication Sig Dispense Refill  . aspirin 81 MG tablet Take 81 mg by mouth daily.      . Calcium Carbonate-Vitamin D (CALTRATE 600+D) 600-400 MG-UNIT per tablet Take 1 tablet by mouth daily.      . Omega-3 Fatty Acids (FISH OIL) 1000 MG CAPS Take by  mouth daily.      . pravastatin (PRAVACHOL) 40 MG tablet TAKE ONE TABLET BY MOUTH ONCE DAILY 90 tablet 2   No current facility-administered medications on file prior to visit.     BP 102/70 (BP Location: Left Arm, Patient Position: Sitting, Cuff Size: Normal)   Pulse 72   Temp 98.4 F (36.9 C) (Oral)   Resp 18   Ht 5\' 4"  (1.626 m)   Wt 149 lb 8 oz (67.8 kg)   BMI 25.66 kg/m        Review of Systems  Constitutional: Negative.  Negative for appetite change, fatigue, fever and unexpected weight change.  HENT: Positive for ear pain. Negative for congestion, dental problem, hearing loss, mouth sores, nosebleeds, rhinorrhea, sinus pressure, sore throat, tinnitus, trouble swallowing and voice change.   Eyes: Negative for photophobia, pain, discharge, redness and visual disturbance.  Respiratory: Negative for cough, chest tightness and shortness of breath.   Cardiovascular: Negative for chest pain, palpitations and leg swelling.  Gastrointestinal: Negative for abdominal distention, abdominal pain, blood in stool, constipation, diarrhea, nausea, rectal pain and vomiting.  Genitourinary: Negative for difficulty urinating, dysuria, flank pain, frequency, genital sores, hematuria, menstrual problem, pelvic  pain, urgency, vaginal bleeding, vaginal discharge and vaginal pain.  Musculoskeletal: Positive for back pain. Negative for arthralgias, gait problem, joint swelling and neck stiffness.  Skin: Negative for rash.  Neurological: Negative for dizziness, syncope, speech difficulty, weakness, light-headedness, numbness and headaches.  Hematological: Negative for adenopathy. Does not bruise/bleed easily.  Psychiatric/Behavioral: Negative for agitation, behavioral problems, dysphoric mood, self-injury and suicidal ideas. The patient is not nervous/anxious.        Objective:   Physical Exam  Constitutional: She is oriented to person, place, and time. She appears well-developed and  well-nourished. No distress.  HENT:  Head: Normocephalic and atraumatic.  Right Ear: External ear normal.  Left Ear: External ear normal.  Mouth/Throat: Oropharynx is clear and moist.  Eyes: Conjunctivae and EOM are normal.  Neck: Normal range of motion. Neck supple. No JVD present. No thyromegaly present.  Cardiovascular: Normal rate, regular rhythm, normal heart sounds and intact distal pulses.   No murmur heard. Pulmonary/Chest: Effort normal and breath sounds normal. She has no wheezes. She has no rales.  Abdominal: Soft. Bowel sounds are normal. She exhibits no distension and no mass. There is no tenderness. There is no rebound and no guarding.  Genitourinary: Vagina normal and uterus normal. No vaginal discharge found.  Genitourinary Comments: Specimen obtained  Musculoskeletal: Normal range of motion. She exhibits no edema or tenderness.  Neurological: She is alert and oriented to person, place, and time. She has normal reflexes. No cranial nerve deficit. She exhibits normal muscle tone. Coordination normal.  Skin: Skin is warm and dry. No rash noted.  Psychiatric: She has a normal mood and affect. Her behavior is normal.          Assessment & Plan:   Preventive health examination Dyslipidemia stable.  Patient has been on statin therapy prescribed by another provider.  Current guidelines for statin therapy discussed.  She will consider discontinuation of statin therapy prior to her next lab draw.    Colonoscopy 6 months   Return in one year for followup  Nyoka Cowden

## 2016-02-06 NOTE — Progress Notes (Signed)
Pre visit review using our clinic review tool, if applicable. No additional management support is needed unless otherwise documented below in the visit note. 

## 2016-02-10 LAB — CYTOLOGY - PAP: DIAGNOSIS: NEGATIVE

## 2016-02-17 ENCOUNTER — Encounter: Payer: Self-pay | Admitting: *Deleted

## 2016-02-17 NOTE — Progress Notes (Signed)
Unable to reach pt, left several messages pt has not returned call. Letter sent to pt, pap smear normal.

## 2016-03-26 DIAGNOSIS — Z0100 Encounter for examination of eyes and vision without abnormal findings: Secondary | ICD-10-CM | POA: Diagnosis not present

## 2016-04-30 ENCOUNTER — Other Ambulatory Visit: Payer: Self-pay | Admitting: Family Medicine

## 2017-02-07 ENCOUNTER — Encounter: Payer: Self-pay | Admitting: Internal Medicine

## 2017-02-07 ENCOUNTER — Ambulatory Visit (INDEPENDENT_AMBULATORY_CARE_PROVIDER_SITE_OTHER): Payer: BLUE CROSS/BLUE SHIELD | Admitting: Internal Medicine

## 2017-02-07 ENCOUNTER — Other Ambulatory Visit (HOSPITAL_COMMUNITY)
Admission: RE | Admit: 2017-02-07 | Discharge: 2017-02-07 | Disposition: A | Discharge status: Home / Self Care | Payer: BLUE CROSS/BLUE SHIELD | Source: Ambulatory Visit | Attending: Internal Medicine | Admitting: Internal Medicine

## 2017-02-07 VITALS — BP 118/62 | HR 65 | Temp 97.6°F | Ht 64.0 in | Wt 156.4 lb

## 2017-02-07 DIAGNOSIS — Z0000 Encounter for general adult medical examination without abnormal findings: Secondary | ICD-10-CM | POA: Diagnosis not present

## 2017-02-07 DIAGNOSIS — Z23 Encounter for immunization: Secondary | ICD-10-CM | POA: Diagnosis not present

## 2017-02-07 NOTE — Progress Notes (Signed)
Subjective:    Patient ID: Megan Lutz, female    DOB: 1950-11-17, 66 y.o.   MRN: [id number]  HPI 66 year old patient, still employed full-time, seen today for an annual exam. She is on 5 year colonoscopy recalls due to a family history of colon cancer.  She remains on pravastatin for dyslipidemia. She is doing quite well without concerns or complaints.  Social history divorced , has been engaged to her fianc who lives in [place] for 17 years  Past Medical History:  Diagnosis Date  . Allergy   . ANEMIA 09/25/2009  . Diverticular disease 2013  . Hemorrhoid   . HYPERLIPIDEMIA 12/07/2007  . Kidney stone   . OSTEOPENIA 12/07/2007  . PERIMENOPAUSAL SYNDROME 12/10/2008     Social History   Social History  . Marital status: Divorced    Spouse name: N/A  . Number of children: N/A  . Years of education: N/A   Occupational History  . Not on file.   Social History Main Topics  . Smoking status: Never Smoker  . Smokeless tobacco: Never Used  . Alcohol use Yes  . Drug use: No  . Sexual activity: Not on file   Other Topics Concern  . Not on file   Social History Narrative  . No narrative on file    Past Surgical History:  Procedure Laterality Date  . FRACTURE SURGERY    . TONSILLECTOMY      Family History  Problem Relation Age of Onset  . Cancer Mother   . Cancer Father     Allergies  Allergen Reactions  . Codeine Phosphate Nausea And Vomiting    Current Outpatient Prescriptions on File Prior to Visit  Medication Sig Dispense Refill  . aspirin 81 MG tablet Take 81 mg by mouth daily.      . Calcium Carbonate-Vitamin D (CALTRATE 600+D) 600-400 MG-UNIT per tablet Take 1 tablet by mouth daily.      Marland Kitchen neomycin-polymyxin b-dexamethasone (MAXITROL) 3.5-10000-0.1 OINT Place 1 application into both eyes as needed.   0  . Omega-3 Fatty Acids (FISH OIL) 1000 MG CAPS Take by mouth daily.      . pravastatin (PRAVACHOL) 40 MG tablet TAKE ONE TABLET BY MOUTH ONCE DAILY  90 tablet 2   No current facility-administered medications on file prior to visit.     BP 118/62 (BP Location: Left Arm, Patient Position: Sitting, Cuff Size: Normal)   Pulse 65   Temp 97.6 F (36.4 C) (Oral)   Ht 5\' 4"  (1.626 m)   Wt 156 lb 6.4 oz (70.9 kg)   SpO2 98%   BMI 26.85 kg/m      Review of Systems  Constitutional: Negative.   HENT: Negative for congestion, dental problem, hearing loss, rhinorrhea, sinus pressure, sore throat and tinnitus.   Eyes: Negative for pain, discharge and visual disturbance.  Respiratory: Negative for cough and shortness of breath.   Cardiovascular: Negative for chest pain, palpitations and leg swelling.  Gastrointestinal: Negative for abdominal distention, abdominal pain, blood in stool, constipation, diarrhea, nausea and vomiting.  Genitourinary: Negative for difficulty urinating, dysuria, flank pain, frequency, hematuria, pelvic pain, urgency, vaginal bleeding, vaginal discharge and vaginal pain.  Musculoskeletal: Negative for arthralgias, gait problem and joint swelling.  Skin: Negative for rash.  Neurological: Negative for dizziness, syncope, speech difficulty, weakness, numbness and headaches.  Hematological: Negative for adenopathy.  Psychiatric/Behavioral: Negative for agitation, behavioral problems and dysphoric mood. The patient is not nervous/anxious.  Objective:   Physical Exam  Constitutional: She is oriented to person, place, and time. She appears well-developed and well-nourished.  HENT:  Head: Normocephalic and atraumatic.  Right Ear: External ear normal.  Left Ear: External ear normal.  Mouth/Throat: Oropharynx is clear and moist.  Eyes: Conjunctivae and EOM are normal.  Neck: Normal range of motion. Neck supple. No JVD present. No thyromegaly present.  Cardiovascular: Normal rate, regular rhythm, normal heart sounds and intact distal pulses.   No murmur heard. Pulmonary/Chest: Effort normal and breath sounds  normal. She has no wheezes. She has no rales.  Abdominal: Soft. Bowel sounds are normal. She exhibits no distension and no mass. There is no tenderness. There is no rebound and no guarding.  Genitourinary: Vagina normal and uterus normal.  Genitourinary Comments: Normal-appearing cervix Pap specimens obtained  Musculoskeletal: Normal range of motion. She exhibits no edema or tenderness.  Neurological: She is alert and oriented to person, place, and time. She has normal reflexes. No cranial nerve deficit. She exhibits normal muscle tone. Coordination normal.  Skin: Skin is warm and dry. No rash noted.  Psychiatric: She has a normal mood and affect. Her behavior is normal.          Assessment & Plan:   Preventive health examination  Mammogram Bone density study Colonoscopy Flu vaccine administered Prevnar 13 administered  Follow-up one year  Nyoka Cowden

## 2017-02-07 NOTE — Patient Instructions (Addendum)
Take a calcium supplement, plus [phone] units of vitamin D  Bone Density  Schedule your colonoscopy to help detect colon cancer.  Return in one year for follow-up    Health Maintenance for Postmenopausal Women Menopause is a normal process in which your reproductive ability comes to an end. This process happens gradually over a span of months to years, usually between the ages of 48 and 60. Menopause is complete when you have missed 12 consecutive menstrual periods. It is important to talk with your health care provider about some of the most common conditions that affect postmenopausal women, such as heart disease, cancer, and bone loss (osteoporosis). Adopting a healthy lifestyle and getting preventive care can help to promote your health and wellness. Those actions can also lower your chances of developing some of these common conditions. What should I know about menopause? During menopause, you may experience a number of symptoms, such as:  Moderate-to-severe hot flashes.  Night sweats.  Decrease in sex drive.  Mood swings.  Headaches.  Tiredness.  Irritability.  Memory problems.  Insomnia.  Choosing to treat or not to treat menopausal changes is an individual decision that you make with your health care provider. What should I know about hormone replacement therapy and supplements? Hormone therapy products are effective for treating symptoms that are associated with menopause, such as hot flashes and night sweats. Hormone replacement carries certain risks, especially as you become older. If you are thinking about using estrogen or estrogen with progestin treatments, discuss the benefits and risks with your health care provider. What should I know about heart disease and stroke? Heart disease, heart attack, and stroke become more likely as you age. This may be due, in part, to the hormonal changes that your body experiences during menopause. These can affect how your body  processes dietary fats, triglycerides, and cholesterol. Heart attack and stroke are both medical emergencies. There are many things that you can do to help prevent heart disease and stroke:  Have your blood pressure checked at least every 1-2 years. High blood pressure causes heart disease and increases the risk of stroke.  If you are 53-71 years old, ask your health care provider if you should take aspirin to prevent a heart attack or a stroke.  Do not use any tobacco products, including cigarettes, chewing tobacco, or electronic cigarettes. If you need help quitting, ask your health care provider.  It is important to eat a healthy diet and maintain a healthy weight. ? Be sure to include plenty of vegetables, fruits, low-fat dairy products, and lean protein. ? Avoid eating foods that are high in solid fats, added sugars, or salt (sodium).  Get regular exercise. This is one of the most important things that you can do for your health. ? Try to exercise for at least 150 minutes each week. The type of exercise that you do should increase your heart rate and make you sweat. This is known as moderate-intensity exercise. ? Try to do strengthening exercises at least twice each week. Do these in addition to the moderate-intensity exercise.  Know your numbers.Ask your health care provider to check your cholesterol and your blood glucose. Continue to have your blood tested as directed by your health care provider.  What should I know about cancer screening? There are several types of cancer. Take the following steps to reduce your risk and to catch any cancer development as early as possible. Breast Cancer  Practice breast self-awareness. ? This means understanding how  your breasts normally appear and feel. ? It also means doing regular breast self-exams. Let your health care provider know about any changes, no matter how small.  If you are 1 or older, have a clinician do a breast exam (clinical  breast exam or CBE) every year. Depending on your age, family history, and medical history, it may be recommended that you also have a yearly breast X-ray (mammogram).  If you have a family history of breast cancer, talk with your health care provider about genetic screening.  If you are at high risk for breast cancer, talk with your health care provider about having an MRI and a mammogram every year.  Breast cancer (BRCA) gene test is recommended for women who have family members with BRCA-related cancers. Results of the assessment will determine the need for genetic counseling and BRCA1 and for BRCA2 testing. BRCA-related cancers include these types: ? Breast. This occurs in males or females. ? Ovarian. ? Tubal. This may also be called fallopian tube cancer. ? Cancer of the abdominal or pelvic lining (peritoneal cancer). ? Prostate. ? Pancreatic.  Cervical, Uterine, and Ovarian Cancer Your health care provider may recommend that you be screened regularly for cancer of the pelvic organs. These include your ovaries, uterus, and vagina. This screening involves a pelvic exam, which includes checking for microscopic changes to the surface of your cervix (Pap test).  For women ages 21-65, health care providers may recommend a pelvic exam and a Pap test every three years. For women ages 33-65, they may recommend the Pap test and pelvic exam, combined with testing for human papilloma virus (HPV), every five years. Some types of HPV increase your risk of cervical cancer. Testing for HPV may also be done on women of any age who have unclear Pap test results.  Other health care providers may not recommend any screening for nonpregnant women who are considered low risk for pelvic cancer and have no symptoms. Ask your health care provider if a screening pelvic exam is right for you.  If you have had past treatment for cervical cancer or a condition that could lead to cancer, you need Pap tests and  screening for cancer for at least 20 years after your treatment. If Pap tests have been discontinued for you, your risk factors (such as having a new sexual partner) need to be reassessed to determine if you should start having screenings again. Some women have medical problems that increase the chance of getting cervical cancer. In these cases, your health care provider may recommend that you have screening and Pap tests more often.  If you have a family history of uterine cancer or ovarian cancer, talk with your health care provider about genetic screening.  If you have vaginal bleeding after reaching menopause, tell your health care provider.  There are currently no reliable tests available to screen for ovarian cancer.  Lung Cancer Lung cancer screening is recommended for adults 61-74 years old who are at high risk for lung cancer because of a history of smoking. A yearly low-dose CT scan of the lungs is recommended if you:  Currently smoke.  Have a history of at least 30 pack-years of smoking and you currently smoke or have quit within the past 15 years. A pack-year is smoking an average of one pack of cigarettes per day for one year.  Yearly screening should:  Continue until it has been 15 years since you quit.  Stop if you develop a health problem that  would prevent you from having lung cancer treatment.  Colorectal Cancer  This type of cancer can be detected and can often be prevented.  Routine colorectal cancer screening usually begins at age 15 and continues through age 18.  If you have risk factors for colon cancer, your health care provider may recommend that you be screened at an earlier age.  If you have a family history of colorectal cancer, talk with your health care provider about genetic screening.  Your health care provider may also recommend using home test kits to check for hidden blood in your stool.  A small camera at the end of a tube can be used to examine  your colon directly (sigmoidoscopy or colonoscopy). This is done to check for the earliest forms of colorectal cancer.  Direct examination of the colon should be repeated every 5-10 years until age 69. However, if early forms of precancerous polyps or small growths are found or if you have a family history or genetic risk for colorectal cancer, you may need to be screened more often.  Skin Cancer  Check your skin from head to toe regularly.  Monitor any moles. Be sure to tell your health care provider: ? About any new moles or changes in moles, especially if there is a change in a mole's shape or color. ? If you have a mole that is larger than the size of a pencil eraser.  If any of your family members has a history of skin cancer, especially at a young age, talk with your health care provider about genetic screening.  Always use sunscreen. Apply sunscreen liberally and repeatedly throughout the day.  Whenever you are outside, protect yourself by wearing long sleeves, pants, a wide-brimmed hat, and sunglasses.  What should I know about osteoporosis? Osteoporosis is a condition in which bone destruction happens more quickly than new bone creation. After menopause, you may be at an increased risk for osteoporosis. To help prevent osteoporosis or the bone fractures that can happen because of osteoporosis, the following is recommended:  If you are 65-18 years old, get at least 1,000 mg of calcium and at least 600 mg of vitamin D per day.  If you are older than age 63 but younger than age 12, get at least 1,200 mg of calcium and at least 600 mg of vitamin D per day.  If you are older than age 15, get at least 1,200 mg of calcium and at least 800 mg of vitamin D per day.  Smoking and excessive alcohol intake increase the risk of osteoporosis. Eat foods that are rich in calcium and vitamin D, and do weight-bearing exercises several times each week as directed by your health care provider. What  should I know about how menopause affects my mental health? Depression may occur at any age, but it is more common as you become older. Common symptoms of depression include:  Low or sad mood.  Changes in sleep patterns.  Changes in appetite or eating patterns.  Feeling an overall lack of motivation or enjoyment of activities that you previously enjoyed.  Frequent crying spells.  Talk with your health care provider if you think that you are experiencing depression. What should I know about immunizations? It is important that you get and maintain your immunizations. These include:  Tetanus, diphtheria, and pertussis (Tdap) booster vaccine.  Influenza every year before the flu season begins.  Pneumonia vaccine.  Shingles vaccine.  Your health care provider may also recommend other immunizations.  This information is not intended to replace advice given to you by your health care provider. Make sure you discuss any questions you have with your health care provider. Document Released: 05/28/2005 Document Revised: 10/24/2015 Document Reviewed: 01/07/2015 Elsevier Interactive Patient Education  2018 [employer].

## 2017-02-08 LAB — COMPREHENSIVE METABOLIC PANEL
ALBUMIN: 4.4 g/dL (ref 3.5–5.2)
ALK PHOS: 63 U/L (ref 39–117)
ALT: 21 U/L (ref 0–35)
AST: 24 U/L (ref 0–37)
BILIRUBIN TOTAL: 0.6 mg/dL (ref 0.2–1.2)
BUN: 13 mg/dL (ref 6–23)
CALCIUM: 9.5 mg/dL (ref 8.4–10.5)
CO2: 28 meq/L (ref 19–32)
CREATININE: 0.85 mg/dL (ref 0.40–1.20)
Chloride: 104 mEq/L (ref 96–112)
GFR: 71.3 mL/min (ref >60.00)
Glucose, Bld: 87 mg/dL (ref 70–99)
Potassium: 4 mEq/L (ref 3.5–5.1)
Sodium: 139 mEq/L (ref 135–145)
TOTAL PROTEIN: 7.1 g/dL (ref 6.0–8.3)

## 2017-02-08 LAB — LIPID PANEL
CHOL/HDL RATIO: 3
CHOLESTEROL: 182 mg/dL (ref 0–200)
HDL: 58.5 mg/dL (ref >39.00)
LDL Cholesterol: 96 mg/dL (ref 0–99)
NonHDL: 123.71
TRIGLYCERIDES: 140 mg/dL (ref 0.0–149.0)
VLDL: 28 mg/dL (ref 0.0–40.0)

## 2017-02-08 LAB — CBC WITH DIFFERENTIAL/PLATELET
BASOS ABS: 0.1 10*3/uL (ref 0.0–0.1)
Basophils Relative: 1.1 % (ref 0.0–3.0)
EOS ABS: 0.1 10*3/uL (ref 0.0–0.7)
Eosinophils Relative: 1.5 % (ref 0.0–5.0)
HEMATOCRIT: 39.6 % (ref 36.0–46.0)
Hemoglobin: 13.4 g/dL (ref 12.0–15.0)
LYMPHS ABS: 2.3 10*3/uL (ref 0.7–4.0)
LYMPHS PCT: 40 % (ref 12.0–46.0)
MCHC: 33.9 g/dL (ref 30.0–36.0)
MCV: 91.7 fl (ref 78.0–100.0)
Monocytes Absolute: 0.5 10*3/uL (ref 0.1–1.0)
Monocytes Relative: 9 % (ref 3.0–12.0)
NEUTROS ABS: 2.8 10*3/uL (ref 1.4–7.7)
NEUTROS PCT: 48.4 % (ref 43.0–77.0)
PLATELETS: 284 10*3/uL (ref 150.0–400.0)
RBC: 4.32 Mil/uL (ref 3.87–5.11)
RDW: 12.7 % (ref 11.5–15.5)
WBC: 5.7 10*3/uL (ref 4.0–10.5)

## 2017-02-08 LAB — TSH: TSH: 3.35 u[IU]/mL (ref 0.35–4.50)

## 2017-02-09 ENCOUNTER — Telehealth: Payer: Self-pay | Admitting: Internal Medicine

## 2017-02-09 LAB — CYTOLOGY - PAP: DIAGNOSIS: NEGATIVE

## 2017-02-09 NOTE — Telephone Encounter (Signed)
[facility] - Client [place]  Patient Name: Megan Lutz  DOB: 27-Jun-1950    Initial Comment Caller states she was seen Monday for physical, flu and pneumonia shots given, in alot of pain since yesterday. Knot red and hot where shot sight is, headache, body aches, never prewarned about this. She is wondering if this is normal for these shots.    Nurse Assessment  Nurse: Sherrell Puller, RN, Amy Date/Time Eilene Ghazi Time): 02/09/2017 10:54:33 AM  Confirm and document reason for call. If symptomatic, describe symptoms. ---Caller states she had the Flu and Pneumonia shot on Monday and started having headache and muscle aches about 4 hours after. Says the vaccination sites are sore. Right arm where Pneumonia shot was given the arm is hot, she feels a knot and has redness. She's not sure if she has a fever but not feeling chilled or feverish. States she feels 95% better than she did.  Does the patient have any new or worsening symptoms? ---Yes  Lutz a triage be completed? ---Yes  Related visit to physician within the last 2 weeks? ---Yes  Does the PT have any chronic conditions? (i.e. diabetes, asthma, etc.) ---No  Is this a behavioral health or substance abuse call? ---No     Guidelines    Guideline Title Affirmed Question Affirmed Notes  Immunization Reactions Injection site reaction to any vaccine    Final Disposition User   Roosevelt, RN, Amy    Caller Disagree/Comply Comply  Caller Understands Yes  PreDisposition Home Care

## 2017-02-10 ENCOUNTER — Encounter: Payer: Self-pay | Admitting: Internal Medicine

## 2017-02-15 ENCOUNTER — Telehealth: Payer: Self-pay | Admitting: Internal Medicine

## 2017-02-15 DIAGNOSIS — Z13820 Encounter for screening for osteoporosis: Secondary | ICD-10-CM

## 2017-02-15 NOTE — Telephone Encounter (Signed)
Pt saw Dr Raliegh Ip 10/22 and states he was to place a bone density referral to the breast center. Is that ok?

## 2017-02-15 NOTE — Telephone Encounter (Signed)
Yes okay for bone density study

## 2017-02-15 NOTE — Telephone Encounter (Signed)
Referral in epic, called pt and left a voicemail with number to [facility] to schedule her dexa scan.

## 2017-02-16 ENCOUNTER — Other Ambulatory Visit: Payer: Self-pay | Admitting: Family Medicine

## 2017-02-16 ENCOUNTER — Other Ambulatory Visit: Payer: Self-pay | Admitting: Internal Medicine

## 2017-02-16 DIAGNOSIS — Z1231 Encounter for screening mammogram for malignant neoplasm of breast: Secondary | ICD-10-CM

## 2017-02-16 DIAGNOSIS — M810 Age-related osteoporosis without current pathological fracture: Secondary | ICD-10-CM

## 2017-02-24 ENCOUNTER — Telehealth: Payer: Self-pay

## 2017-02-24 NOTE — Telephone Encounter (Signed)
Records received from [facility]. Pt has a family hx of colon cancer (father age 65.) Wanting to schedule repeat colonoscopy. Please review as Doc of the Day.

## 2017-02-24 NOTE — Telephone Encounter (Signed)
Records reviewed.  (For documentation purposes - colonoscopy by Dr. Lorne Skeens at [facility], [place], [place] on 06/08/2011. A few small left-sided diverticuli were seen.  2 subcentimeter right colon tubular adenomas removed, 2 subcentimeter rectal tubular adenomas removed)  She is due for surveillance colonoscopy.  Diagnosis: Personal history of colon polyps  It can be directly booked on my schedule through an [facility] nurse visit. Please make the arrangements.

## 2017-02-25 ENCOUNTER — Encounter: Payer: Self-pay | Admitting: Gastroenterology

## 2017-02-25 NOTE — Telephone Encounter (Signed)
Left message for patient notifying them of this and to call back and schedule appointment.

## 2017-03-09 ENCOUNTER — Ambulatory Visit (AMBULATORY_SURGERY_CENTER): Payer: Self-pay | Admitting: *Deleted

## 2017-03-09 ENCOUNTER — Other Ambulatory Visit: Payer: Self-pay

## 2017-03-09 VITALS — Ht 65.0 in | Wt 154.0 lb

## 2017-03-09 DIAGNOSIS — Z800 Family history of malignant neoplasm of digestive organs: Secondary | ICD-10-CM

## 2017-03-09 DIAGNOSIS — Z86010 Personal history of colonic polyps: Secondary | ICD-10-CM

## 2017-03-09 MED ORDER — PEG-KCL-NACL-NASULF-NA ASC-C 140 G PO SOLR: 1.0000 | ORAL | 0 refills | Status: DC

## 2017-03-09 NOTE — Progress Notes (Signed)
Patient denies any allergies to eggs or soy. Patient denies any problems with anesthesia/sedation. Patient denies any oxygen use at home. Patient denies taking any diet/weight loss medications or blood thinners. EMMI education assisgned to patient on colonoscopy, this was explained and instructions given to patient. 

## 2017-03-17 ENCOUNTER — Other Ambulatory Visit: Payer: BLUE CROSS/BLUE SHIELD

## 2017-03-17 ENCOUNTER — Ambulatory Visit
Admission: RE | Admit: 2017-03-17 | Discharge: 2017-03-17 | Disposition: A | Discharge status: Home / Self Care | Payer: BLUE CROSS/BLUE SHIELD | Source: Ambulatory Visit | Attending: Internal Medicine | Admitting: Internal Medicine

## 2017-03-17 DIAGNOSIS — Z1231 Encounter for screening mammogram for malignant neoplasm of breast: Secondary | ICD-10-CM | POA: Diagnosis not present

## 2017-03-24 ENCOUNTER — Telehealth: Payer: Self-pay | Admitting: Gastroenterology

## 2017-03-24 NOTE — Telephone Encounter (Signed)
Spoke with the patient and she had many questions about sedation. I answered all of her questions and she is feeling more confident now. Her procedure is tomorrow.

## 2017-03-25 ENCOUNTER — Ambulatory Visit (AMBULATORY_SURGERY_CENTER): Payer: BLUE CROSS/BLUE SHIELD | Admitting: Gastroenterology

## 2017-03-25 ENCOUNTER — Encounter: Payer: Self-pay | Admitting: Gastroenterology

## 2017-03-25 ENCOUNTER — Other Ambulatory Visit: Payer: Self-pay

## 2017-03-25 VITALS — BP 94/54 | HR 66 | Temp 98.4°F | Resp 9 | Ht 65.5 in | Wt 156.0 lb

## 2017-03-25 DIAGNOSIS — D123 Benign neoplasm of transverse colon: Secondary | ICD-10-CM

## 2017-03-25 DIAGNOSIS — Z86010 Personal history of colonic polyps: Secondary | ICD-10-CM | POA: Diagnosis not present

## 2017-03-25 MED ORDER — SODIUM CHLORIDE 0.9 % IV SOLN: 500.0000 mL | Freq: Once | INTRAVENOUS | Status: DC

## 2017-03-25 NOTE — Progress Notes (Signed)
To recovery, report to RN, VSS. 

## 2017-03-25 NOTE — Patient Instructions (Signed)
YOU HAD AN ENDOSCOPIC PROCEDURE TODAY AT THE [facility]:   Refer to the procedure report that was given to you for any specific questions about what was found during the examination.  If the procedure report does not answer your questions, please call your gastroenterologist to clarify.  If you requested that your care partner not be given the details of your procedure findings, then the procedure report has been included in a sealed envelope for you to review at your convenience later.  YOU SHOULD EXPECT: Some feelings of bloating in the abdomen. Passage of more gas than usual.  Walking can help get rid of the air that was put into your GI tract during the procedure and reduce the bloating. If you had a lower endoscopy (such as a colonoscopy or flexible sigmoidoscopy) you may notice spotting of blood in your stool or on the toilet paper. If you underwent a bowel prep for your procedure, you may not have a normal bowel movement for a few days.  Please Note:  You might notice some irritation and congestion in your nose or some drainage.  This is from the oxygen used during your procedure.  There is no need for concern and it should clear up in a day or so.  SYMPTOMS TO REPORT IMMEDIATELY:   Following lower endoscopy (colonoscopy or flexible sigmoidoscopy):  Excessive amounts of blood in the stool  Significant tenderness or worsening of abdominal pains  Swelling of the abdomen that is new, acute  Fever of 100F or higher   For urgent or emergent issues, a gastroenterologist can be reached at any hour by calling [phone].   DIET:  We do recommend a small meal at first, but then you may proceed to your regular diet.  Drink plenty of fluids but you should avoid alcoholic beverages for 24 hours. Try to increase the fiber in your diet, and drink plenty of water.  ACTIVITY:  You should plan to take it easy for the rest of today and you should NOT DRIVE or use heavy machinery until  tomorrow (because of the sedation medicines used during the test).    FOLLOW UP: Our staff will call the number listed on your records the next business day following your procedure to check on you and address any questions or concerns that you may have regarding the information given to you following your procedure. If we do not reach you, we will leave a message.  However, if you are feeling well and you are not experiencing any problems, there is no need to return our call.  We will assume that you have returned to your regular daily activities without incident.  If any biopsies were taken you will be contacted by phone or by letter within the next 1-3 weeks.  Please call us at [phone] if you have not heard about the biopsies in 3 weeks.    SIGNATURES/CONFIDENTIALITY: You and/or your care partner have signed paperwork which will be entered into your electronic medical record.  These signatures attest to the fact that that the information above on your After Visit Summary has been reviewed and is understood.  Full responsibility of the confidentiality of this discharge information lies with you and/or your care-partner.  Read all handouts given to you by your recovery room nurse. 

## 2017-03-25 NOTE — Op Note (Signed)
[facility] Patient Name: Megan Lutz Procedure Date: 03/25/2017 1:18 PM MRN: [id number] Endoscopist: Haysi. Loletha Carrow , MD Age: 66 Referring MD:  Date of Birth: 08/22/1950 Gender: Female Account #: [id number] Procedure:                Colonoscopy Indications:              Surveillance: Personal history of adenomatous                            polyps on last colonoscopy > 5 years ago (four TA                            polyps, each < 32mm; 05/2011) Medicines:                Monitored Anesthesia Care Procedure:                Pre-Anesthesia Assessment:                           - Prior to the procedure, a History and Physical                            was performed, and patient medications and                            allergies were reviewed. The patient's tolerance of                            previous anesthesia was also reviewed. The risks                            and benefits of the procedure and the sedation                            options and risks were discussed with the patient.                            All questions were answered, and informed consent                            was obtained. Anticoagulants: The patient has taken                            aspirin. It was decided not to withhold this                            medication prior to the procedure. ASA Grade                            Assessment: II - A patient with mild systemic                            disease. After reviewing the risks and benefits,  the patient was deemed in satisfactory condition to                            undergo the procedure.                           After obtaining informed consent, the colonoscope                            was passed under direct vision. Throughout the                            procedure, the patient's blood pressure, pulse, and                            oxygen saturations were monitored continuously. The                             Model PCF-[id number]) scope was introduced                            through the anus and advanced to the the cecum,                            identified by appendiceal orifice and ileocecal                            valve. The colonoscopy was performed without                            difficulty. The patient tolerated the procedure                            well. The quality of the bowel preparation was                            excellent. The ileocecal valve, appendiceal                            orifice, and rectum were photographed. The quality                            of the bowel preparation was evaluated using the                            BBPS [facility] Bowel Preparation Scale) with scores                            of: Right Colon = 3, Transverse Colon = 3 and Left                            Colon = 3 (entire mucosa seen well with no residual  staining, small fragments of stool or opaque                            liquid). The total BBPS score equals 9. The bowel                            preparation used was SUPREP. Scope In: 1:25:35 PM Scope Out: 1:40:19 PM Scope Withdrawal Time: 0 hours 10 minutes 15 seconds  Total Procedure Duration: 0 hours 14 minutes 44 seconds  Findings:                 The perianal and digital rectal examinations were                            normal.                           A 2 mm polyp was found in the splenic flexure. The                            polyp was sessile. The polyp was removed with a                            cold biopsy forceps. Resection and retrieval were                            complete.                           Multiple medium-mouthed diverticula were found in                            the sigmoid colon and transverse colon.                           The exam was otherwise without abnormality on                            direct and retroflexion  views. Complications:            No immediate complications. Estimated Blood Loss:     Estimated blood loss: none. Impression:               - One 2 mm polyp at the splenic flexure, removed                            with a cold biopsy forceps. Resected and retrieved.                           - Diverticulosis in the sigmoid colon and in the                            transverse colon.                           - The examination was otherwise  normal on direct                            and retroflexion views. Recommendation:           - Patient has a contact number available for                            emergencies. The signs and symptoms of potential                            delayed complications were discussed with the                            patient. Return to normal activities tomorrow.                            Written discharge instructions were provided to the                            patient.                           - Resume previous diet.                           - Continue present medications.                           - Await pathology results.                           - Repeat colonoscopy is recommended for                            surveillance. The colonoscopy date will be                            determined after pathology results from today's                            exam become available for review. Sereniti Wan L. Loletha Carrow, MD 03/25/2017 1:46:58 PM This report has been signed electronically.

## 2017-03-25 NOTE — Progress Notes (Signed)
Called to room to assist during endoscopic procedure.  Patient ID and intended procedure confirmed with present staff. Received instructions for my participation in the procedure from the performing physician.  

## 2017-03-25 NOTE — Progress Notes (Signed)
Pt's states no medical or surgical changes since previsit or office visit. 

## 2017-03-29 ENCOUNTER — Encounter: Payer: Self-pay | Admitting: Gastroenterology

## 2017-03-29 DIAGNOSIS — Z0100 Encounter for examination of eyes and vision without abnormal findings: Secondary | ICD-10-CM | POA: Diagnosis not present

## 2017-03-30 ENCOUNTER — Telehealth: Payer: Self-pay | Admitting: *Deleted

## 2017-03-30 NOTE — Telephone Encounter (Signed)
  Follow up Call-  Call back number 03/25/2017  Post procedure Call Back phone  # [phone]  Permission to leave phone message Yes  Some recent data might be hidden     Patient questions:  Do you have a fever, pain , or abdominal swelling? No. Pain Score  0 *  Have you tolerated food without any problems? Yes.    Have you been able to return to your normal activities? Yes.    Do you have any questions about your discharge instructions: Diet   No. Medications  No. Follow up visit  No.  Do you have questions or concerns about your Care? No.  Actions: * If pain score is 4 or above: No action needed, pain <4.

## 2017-04-01 ENCOUNTER — Ambulatory Visit
Admission: RE | Admit: 2017-04-01 | Discharge: 2017-04-01 | Disposition: A | Discharge status: Home / Self Care | Payer: BLUE CROSS/BLUE SHIELD | Source: Ambulatory Visit | Attending: Internal Medicine | Admitting: Internal Medicine

## 2017-04-01 DIAGNOSIS — M810 Age-related osteoporosis without current pathological fracture: Secondary | ICD-10-CM

## 2017-04-01 DIAGNOSIS — Z780 Asymptomatic menopausal state: Secondary | ICD-10-CM | POA: Diagnosis not present

## 2017-04-01 DIAGNOSIS — M85851 Other specified disorders of bone density and structure, right thigh: Secondary | ICD-10-CM | POA: Diagnosis not present

## 2017-04-21 ENCOUNTER — Ambulatory Visit (INDEPENDENT_AMBULATORY_CARE_PROVIDER_SITE_OTHER): Payer: BLUE CROSS/BLUE SHIELD | Admitting: Family Medicine

## 2017-04-21 ENCOUNTER — Encounter: Payer: Self-pay | Admitting: Family Medicine

## 2017-04-21 VITALS — BP 106/69 | HR 87 | Temp 99.4°F | Resp 16 | Wt 150.0 lb

## 2017-04-21 DIAGNOSIS — M545 Low back pain, unspecified: Secondary | ICD-10-CM

## 2017-04-21 DIAGNOSIS — R509 Fever, unspecified: Secondary | ICD-10-CM | POA: Diagnosis not present

## 2017-04-21 DIAGNOSIS — K2900 Acute gastritis without bleeding: Secondary | ICD-10-CM

## 2017-04-21 DIAGNOSIS — G8929 Other chronic pain: Secondary | ICD-10-CM | POA: Diagnosis not present

## 2017-04-21 DIAGNOSIS — R52 Pain, unspecified: Secondary | ICD-10-CM | POA: Diagnosis not present

## 2017-04-21 DIAGNOSIS — R112 Nausea with vomiting, unspecified: Secondary | ICD-10-CM | POA: Diagnosis not present

## 2017-04-21 LAB — POCT INFLUENZA A/B
INFLUENZA B, POC: NEGATIVE
Influenza A, POC: NEGATIVE

## 2017-04-21 NOTE — Progress Notes (Signed)
OFFICE VISIT  04/21/2017   CC:  Chief Complaint  Patient presents with  . Chills    vomiting x 3, fever   HPI:    Patient is a 67 y.o. Caucasian female who presents for onset this morning of diffuse achiness, mainly in her back (back pain chronic) and thighs, started getting subjective fever, sweating, got tremulous in hands.  +HA.  No dysuria, urin frequency, or urinary urgency.  She vomited (clear, small amount) x 3 in the midst of feeling her worse this morning.  No diarrhea.  No abd crampiness.   No cough.  No ST.  No temp measured.   No known sick contacts. No meds taken.    ROS: no melena or hematochezia, no coffee ground emesis, no rash, no focal weakness, no paresthesias, no flank pain, no abd pain.  Drank water today but has not eaten any food.  Past Medical History:  Diagnosis Date  . Allergy   . ANEMIA 09/25/2009  . Diverticular disease 2013  . Hemorrhoid   . HYPERLIPIDEMIA 12/07/2007  . Kidney stone   . OSTEOPENIA 12/07/2007  . PERIMENOPAUSAL SYNDROME 12/10/2008    Past Surgical History:  Procedure Laterality Date  . COLONOSCOPY    . FRACTURE SURGERY  2000   finger-pins placed. "hard to wake up from anesthesia" per pt  . POLYPECTOMY    . TONSILLECTOMY      Outpatient Medications Prior to Visit  Medication Sig Dispense Refill  . aspirin 81 MG tablet Take 81 mg by mouth daily.      . pravastatin (PRAVACHOL) 40 MG tablet TAKE ONE TABLET BY MOUTH ONCE DAILY 90 tablet 2   Facility-Administered Medications Prior to Visit  Medication Dose Route Frequency Provider Last Rate Last Dose  . 0.9 %  sodium chloride infusion  500 mL Intravenous Once Doran Stabler, MD        Allergies  Allergen Reactions  . Codeine Phosphate Nausea And Vomiting    ROS As per HPI  PE: Blood pressure 106/69, pulse 87, temperature 99.4 F (37.4 C), temperature source Oral, resp. rate 16, weight 150 lb (68 kg), SpO2 95 %. Exam chaperoned by Starla Link, CMA. Gen: Alert, well  appearing.  Patient is oriented to person, place, time, and situation. AFFECT: pleasant, lucid thought and speech. AOZ:HYQM: no injection, icteris, swelling, or exudate.  EOMI, PERRLA. Mouth: lips without lesion/swelling.  Oral mucosa pink and moist. Oropharynx without erythema, exudate, or swelling.  Neck - No masses or thyromegaly or limitation in range of motion CV: RRR, no m/r/g.   LUNGS: CTA bilat, nonlabored resps, good aeration in all lung fields. ABD: soft, NT, ND, BS normal.  No hepatospenomegaly or mass.  No bruits. EXT: no clubbing, cyanosis, or edema.  Back: mild TTP over soft tissues on R at the level of L3-L5. LE strength 5/5 prox and dist.  No midline back TTP.  LABS:  Flu A/B: negative today.  IMPRESSION AND PLAN:  Acute illness this morning that sounds c/w acute gastritis, ?possible panic attack. She appears perfectly well at this time. Push fluids, restart bland diet, advance as tolerated. Start zantac 150 mg bid x 7d. Hold pravastatin and ASA until feeling well again. Watchful waiting approach. Signs/symptoms to call or return for were reviewed and pt expressed understanding.  Spent 25 min with pt today, with >50% of this time spent in counseling and care coordination regarding the above problems.  An After Visit Summary was printed and given to the  patient.  FOLLOW UP: Return if symptoms worsen or fail to improve.  Signed:  Crissie Sickles, MD           04/21/2017

## 2017-04-21 NOTE — Patient Instructions (Signed)
Take generic over the counter zantac 150mg , 1 tab twice a day for 7d.

## 2017-05-25 ENCOUNTER — Telehealth: Payer: Self-pay | Admitting: Internal Medicine

## 2017-05-25 NOTE — Telephone Encounter (Signed)
Printed out bone density results; placed in MD folder for review.

## 2017-05-25 NOTE — Telephone Encounter (Signed)
    Copied from [place] [id number]. Topic: Quick Communication - Lab Results >> May 12, 2017  4:48 PM Megan Lutz wrote: Reason for CRM: pt called to be contacted or have something in the mail about the results of her bone density test back in [place] of 2018, contact pt to advise

## 2017-05-29 NOTE — Telephone Encounter (Signed)
Notify patient that bone density study revealed osteopenia only.  Suggest follow-up bone density study in 2 years

## 2017-05-30 NOTE — Telephone Encounter (Signed)
Yes,  Take a calcium supplement, plus [phone] units of vitamin D

## 2017-05-30 NOTE — Telephone Encounter (Signed)
Patient was informed of results. Wanted to know if it is okay to take Vitamin D with calcium.

## 2017-05-31 NOTE — Telephone Encounter (Signed)
Left detailed message (per DPR) with instructions.

## 2017-07-05 ENCOUNTER — Encounter: Payer: Self-pay | Admitting: Adult Health

## 2017-07-05 ENCOUNTER — Ambulatory Visit (INDEPENDENT_AMBULATORY_CARE_PROVIDER_SITE_OTHER): Payer: BLUE CROSS/BLUE SHIELD | Admitting: Adult Health

## 2017-07-05 ENCOUNTER — Ambulatory Visit: Payer: Self-pay | Admitting: *Deleted

## 2017-07-05 VITALS — BP 120/80 | Temp 98.5°F | Wt 150.0 lb

## 2017-07-05 DIAGNOSIS — W57XXXA Bitten or stung by nonvenomous insect and other nonvenomous arthropods, initial encounter: Secondary | ICD-10-CM | POA: Diagnosis not present

## 2017-07-05 DIAGNOSIS — S70362A Insect bite (nonvenomous), left thigh, initial encounter: Secondary | ICD-10-CM | POA: Diagnosis not present

## 2017-07-05 MED ORDER — DOXYCYCLINE HYCLATE 100 MG PO CAPS: 200.0000 mg | ORAL_CAPSULE | Freq: Once | ORAL | 0 refills | Status: AC

## 2017-07-05 NOTE — Telephone Encounter (Signed)
Patient was bit by a tick on Saturday night, did not see it till Sunday, did remove, some blood, did save tick. Area on leg is now red, not swollen. On Sunday used alcohol and neosporin.

## 2017-07-05 NOTE — Telephone Encounter (Signed)
Patient is calling to report that she was bitten by a tick- this weekend. She saved the tick- and cleaned the area very well. She states she noticed a red area around the tick bite today. She is coming into the office and bring the tick with her. Reason for Disposition . Red ring or bull's-eye rash occurs at tick bite  Answer Assessment - Initial Assessment Questions 1. TYPE of TICK: "Is it a wood tick or a deer tick?" If unsure, ask: "What size was the tick?" "Did it look more like a watermelon seed or a poppy seed?"      Patient has tick in bottle- oval 2. LOCATION: "Where is the tick bite located?"      Left thigh 3. ONSET: "How long do you think the tick was attached before you removed it?" (Hours or days)      10-12 hours 4. TETANUS: "When was the last tetanus booster?"      Up to date- 2015 5. PREGNANCY: "Is there any chance you are pregnant?" "When was your last menstrual period?"     n/a  Protocols used: TICK BITE-A-AH

## 2017-07-05 NOTE — Progress Notes (Signed)
Subjective:    Patient ID: Megan Lutz, female    DOB: 06-05-1950, 66 y.o.   MRN: [id number]  HPI  67 year old female who  has a past medical history of Allergy, ANEMIA (09/25/2009), Diverticular disease (2013), Hemorrhoid, HYPERLIPIDEMIA (12/07/2007), Kidney stone, OSTEOPENIA (12/07/2007), and PERIMENOPAUSAL SYNDROME (12/10/2008).  She is a patient of Dr. Raliegh Ip who presents to the office today for a tick bite. She reports being bite earlier this week. Tick was attached less than 24 hours. Bite is located on left inner thigh. She removed the tick easily.   Reports localized redness around site of tick bite.   Review of Systems See HPI   Past Medical History:  Diagnosis Date  . Allergy   . ANEMIA 09/25/2009  . Diverticular disease 2013  . Hemorrhoid   . HYPERLIPIDEMIA 12/07/2007  . Kidney stone   . OSTEOPENIA 12/07/2007  . PERIMENOPAUSAL SYNDROME 12/10/2008    Social History   Socioeconomic History  . Marital status: Divorced    Spouse name: Not on file  . Number of children: Not on file  . Years of education: Not on file  . Highest education level: Not on file  Social Needs  . Financial resource strain: Not on file  . Food insecurity - worry: Not on file  . Food insecurity - inability: Not on file  . Transportation needs - medical: Not on file  . Transportation needs - non-medical: Not on file  Occupational History  . Not on file  Tobacco Use  . Smoking status: Never Smoker  . Smokeless tobacco: Never Used  Substance and Sexual Activity  . Alcohol use: Yes    Comment: occ  . Drug use: No  . Sexual activity: Not on file  Other Topics Concern  . Not on file  Social History Narrative  . Not on file    Past Surgical History:  Procedure Laterality Date  . COLONOSCOPY    . FRACTURE SURGERY  2000   finger-pins placed. "hard to wake up from anesthesia" per pt  . POLYPECTOMY    . TONSILLECTOMY      Family History  Problem Relation Age of Onset  . Cancer Mother     . Cancer Father   . Colon cancer Father 23    Allergies  Allergen Reactions  . Codeine Phosphate Nausea And Vomiting    Current Outpatient Medications on File Prior to Visit  Medication Sig Dispense Refill  . aspirin 81 MG tablet Take 81 mg by mouth daily.      . pravastatin (PRAVACHOL) 40 MG tablet TAKE ONE TABLET BY MOUTH ONCE DAILY 90 tablet 2   Current Facility-Administered Medications on File Prior to Visit  Medication Dose Route Frequency Provider Last Rate Last Dose  . 0.9 %  sodium chloride infusion  500 mL Intravenous Once Danis, Estill Cotta III, MD        BP 120/80 (BP Location: Left Arm)   Temp 98.5 F (36.9 C) (Oral)   Wt 150 lb (68 kg)   BMI 24.58 kg/m       Objective:   Physical Exam  Constitutional: She is oriented to person, place, and time. She appears well-developed and well-nourished. No distress.  Cardiovascular: Normal rate, regular rhythm, normal heart sounds and intact distal pulses. Exam reveals no gallop and no friction rub.  No murmur heard. Pulmonary/Chest: Effort normal and breath sounds normal. No respiratory distress. She has no wheezes. She has no rales. She exhibits no tenderness.  Neurological: She is alert and oriented to person, place, and time.  Skin: Skin is warm and dry. Rash noted. She is not diaphoretic.  Small 1 cm erythremic rash noted on left thigh. No bulls eye rash.   Psychiatric: Her behavior is normal. Judgment and thought content normal. Her mood appears anxious.  Nursing note and vitals reviewed.     Assessment & Plan:  1. Tick bite, initial encounter - will treat as prophylactic measure. No body aches, chills, full body rashes.  - doxycycline (VIBRAMYCIN) 100 MG capsule; Take 2 capsules (200 mg total) by mouth once for 1 dose.  Dispense: 2 capsule; Refill: 0   Dorothyann Peng, NP

## 2017-10-11 DIAGNOSIS — L255 Unspecified contact dermatitis due to plants, except food: Secondary | ICD-10-CM | POA: Diagnosis not present

## 2017-12-02 DIAGNOSIS — H1013 Acute atopic conjunctivitis, bilateral: Secondary | ICD-10-CM | POA: Diagnosis not present

## 2017-12-18 ENCOUNTER — Other Ambulatory Visit: Payer: Self-pay | Admitting: Internal Medicine

## 2017-12-23 DIAGNOSIS — H10413 Chronic giant papillary conjunctivitis, bilateral: Secondary | ICD-10-CM | POA: Diagnosis not present

## 2018-01-06 DIAGNOSIS — H10413 Chronic giant papillary conjunctivitis, bilateral: Secondary | ICD-10-CM | POA: Diagnosis not present

## 2018-01-13 DIAGNOSIS — L821 Other seborrheic keratosis: Secondary | ICD-10-CM | POA: Diagnosis not present

## 2018-01-13 DIAGNOSIS — L218 Other seborrheic dermatitis: Secondary | ICD-10-CM | POA: Diagnosis not present

## 2018-01-13 DIAGNOSIS — D225 Melanocytic nevi of trunk: Secondary | ICD-10-CM | POA: Diagnosis not present

## 2018-01-13 DIAGNOSIS — L300 Nummular dermatitis: Secondary | ICD-10-CM | POA: Diagnosis not present

## 2018-02-08 ENCOUNTER — Ambulatory Visit (INDEPENDENT_AMBULATORY_CARE_PROVIDER_SITE_OTHER): Payer: BLUE CROSS/BLUE SHIELD | Admitting: Family Medicine

## 2018-02-08 ENCOUNTER — Encounter: Payer: Self-pay | Admitting: Family Medicine

## 2018-02-08 VITALS — BP 98/64 | HR 76 | Temp 98.6°F | Wt 152.0 lb

## 2018-02-08 DIAGNOSIS — Z0000 Encounter for general adult medical examination without abnormal findings: Secondary | ICD-10-CM | POA: Diagnosis not present

## 2018-02-08 DIAGNOSIS — Z23 Encounter for immunization: Secondary | ICD-10-CM

## 2018-02-08 DIAGNOSIS — E782 Mixed hyperlipidemia: Secondary | ICD-10-CM

## 2018-02-08 NOTE — Progress Notes (Signed)
Subjective:     Megan Lutz is a 67 y.o. female and is here for [facility] and a comprehensive physical exam. Pt previously seen by Dr. Burnice Logan.  The pt reports no problems.  Taking pravastatin 40 mg daily aspirin 81 mg daily.  Pt is trying to eat better, drink more water, and restart exercising daily.  Pt is newly engaged.  Her fianc lives in [place].  Mammogram due November 2019. Colonoscopy up-to-date.  Next one due December 2023  Social History   Socioeconomic History  . Marital status: Divorced    Spouse name: Not on file  . Number of children: Not on file  . Years of education: Not on file  . Highest education level: Not on file  Occupational History  . Not on file  Social Needs  . Financial resource strain: Not on file  . Food insecurity:    Worry: Not on file    Inability: Not on file  . Transportation needs:    Medical: Not on file    Non-medical: Not on file  Tobacco Use  . Smoking status: Never Smoker  . Smokeless tobacco: Never Used  Substance and Sexual Activity  . Alcohol use: Yes    Comment: occ  . Drug use: No  . Sexual activity: Not on file  Lifestyle  . Physical activity:    Days per week: Not on file    Minutes per session: Not on file  . Stress: Not on file  Relationships  . Social connections:    Talks on phone: Not on file    Gets together: Not on file    Attends religious service: Not on file    Active member of club or organization: Not on file    Attends meetings of clubs or organizations: Not on file    Relationship status: Not on file  . Intimate partner violence:    Fear of current or ex partner: Not on file    Emotionally abused: Not on file    Physically abused: Not on file    Forced sexual activity: Not on file  Other Topics Concern  . Not on file  Social History Narrative  . Not on file   Health Maintenance  Topic Date Due  . Hepatitis C Screening  1950-06-06  . INFLUENZA VACCINE  11/17/2017  . PNA vac Low Risk Adult  (2 of 2 - PPSV23) 02/07/2018  . MAMMOGRAM  03/17/2018  . COLONOSCOPY  03/25/2022  . TETANUS/TDAP  12/18/2023  . DEXA SCAN  Completed    The following portions of the patient's history were reviewed and updated as appropriate: allergies, current medications, past family history, past medical history, past social history, past surgical history and problem list.  Review of Systems Pertinent items noted in HPI and remainder of comprehensive ROS otherwise negative.   Objective:    BP 98/64 (BP Location: Left Arm, Patient Position: Sitting, Cuff Size: Normal)   Pulse 76   Temp 98.6 F (37 C) (Oral)   Wt 152 lb (68.9 kg)   SpO2 96%   BMI 24.91 kg/m  General appearance: alert, cooperative and no distress Head: Normocephalic, without obvious abnormality, atraumatic Eyes: conjunctivae/corneas clear. PERRL, EOM's intact. Fundi benign. Ears: normal TM's and external ear canals both ears Nose: Nares normal. Septum midline. Mucosa normal. No drainage or sinus tenderness. Throat: lips, mucosa, and tongue normal; teeth and gums normal Neck: no adenopathy, no carotid bruit, no JVD, supple, symmetrical, trachea midline and thyroid not enlarged, symmetric,  no tenderness/mass/nodules Lungs: clear to auscultation bilaterally Heart: regular rate and rhythm, S1, S2 normal, no murmur, click, rub or gallop Abdomen: soft, non-tender; bowel sounds normal; no masses,  no organomegaly Extremities: extremities normal, atraumatic, no cyanosis or edema Pulses: 2+ and symmetric Skin: Skin color, texture, turgor normal. No rashes or lesions Neurologic: Alert and oriented X 3, normal strength and tone. Normal symmetric reflexes. Normal coordination and gait    Assessment:    Healthy female exam.      Plan:     Anticipatory guidance given including wearing seatbelts, smoke detectors in the home, increasing physical activity, increasing p.o. intake of water and vegetables. -pt to return for labs when  fasting -mammogram due 02/2018, pt to schedule -colonoscopy up to date, due 03/2022 -influenza vaccine given this visit -pt to check with pharmacy for shingrix and PPSV 23 -next CPE in 1 yr See After Visit Summary for Counseling Recommendations    Follow-up PRN  Grier Mitts, MD

## 2018-02-08 NOTE — Patient Instructions (Signed)
Preventive Care 65 Years and Older, Female Preventive care refers to lifestyle choices and visits with your health care provider that can promote health and wellness. What does preventive care include?  A yearly physical exam. This is also called an annual well check.  Dental exams once or twice a year.  Routine eye exams. Ask your health care provider how often you should have your eyes checked.  Personal lifestyle choices, including: ? Daily care of your teeth and gums. ? Regular physical activity. ? Eating a healthy diet. ? Avoiding tobacco and drug use. ? Limiting alcohol use. ? Practicing safe sex. ? Taking low-dose aspirin every day. ? Taking vitamin and mineral supplements as recommended by your health care provider. What happens during an annual well check? The services and screenings done by your health care provider during your annual well check will depend on your age, overall health, lifestyle risk factors, and family history of disease. Counseling Your health care provider may ask you questions about your:  Alcohol use.  Tobacco use.  Drug use.  Emotional well-being.  Home and relationship well-being.  Sexual activity.  Eating habits.  History of falls.  Memory and ability to understand (cognition).  Work and work environment.  Reproductive health.  Screening You may have the following tests or measurements:  Height, weight, and BMI.  Blood pressure.  Lipid and cholesterol levels. These may be checked every 5 years, or more frequently if you are over 50 years old.  Skin check.  Lung cancer screening. You may have this screening every year starting at age 55 if you have a 30-pack-year history of smoking and currently smoke or have quit within the past 15 years.  Fecal occult blood test (FOBT) of the stool. You may have this test every year starting at age 50.  Flexible sigmoidoscopy or colonoscopy. You may have a sigmoidoscopy every 5 years or  a colonoscopy every 10 years starting at age 50.  Hepatitis C blood test.  Hepatitis B blood test.  Sexually transmitted disease (STD) testing.  Diabetes screening. This is done by checking your blood sugar (glucose) after you have not eaten for a while (fasting). You may have this done every 1-3 years.  Bone density scan. This is done to screen for osteoporosis. You may have this done starting at age 65.  Mammogram. This may be done every 1-2 years. Talk to your health care provider about how often you should have regular mammograms.  Talk with your health care provider about your test results, treatment options, and if necessary, the need for more tests. Vaccines Your health care provider may recommend certain vaccines, such as:  Influenza vaccine. This is recommended every year.  Tetanus, diphtheria, and acellular pertussis (Tdap, Td) vaccine. You may need a Td booster every 10 years.  Varicella vaccine. You may need this if you have not been vaccinated.  Zoster vaccine. You may need this after age 60.  Measles, mumps, and rubella (MMR) vaccine. You may need at least one dose of MMR if you were born in 1957 or later. You may also need a second dose.  Pneumococcal 13-valent conjugate (PCV13) vaccine. One dose is recommended after age 65.  Pneumococcal polysaccharide (PPSV23) vaccine. One dose is recommended after age 65.  Meningococcal vaccine. You may need this if you have certain conditions.  Hepatitis A vaccine. You may need this if you have certain conditions or if you travel or work in places where you may be exposed to hepatitis   A.  Hepatitis B vaccine. You may need this if you have certain conditions or if you travel or work in places where you may be exposed to hepatitis B.  Haemophilus influenzae type b (Hib) vaccine. You may need this if you have certain conditions.  Talk to your health care provider about which screenings and vaccines you need and how often you  need them. This information is not intended to replace advice given to you by your health care provider. Make sure you discuss any questions you have with your health care provider. Document Released: 05/02/2015 Document Revised: 12/24/2015 Document Reviewed: 02/04/2015 Elsevier Interactive Patient Education  2018 Elsevier Inc.  

## 2018-02-24 ENCOUNTER — Other Ambulatory Visit (INDEPENDENT_AMBULATORY_CARE_PROVIDER_SITE_OTHER): Payer: BLUE CROSS/BLUE SHIELD

## 2018-02-24 DIAGNOSIS — D3617 Benign neoplasm of peripheral nerves and autonomic nervous system of trunk, unspecified: Secondary | ICD-10-CM | POA: Diagnosis not present

## 2018-02-24 DIAGNOSIS — E782 Mixed hyperlipidemia: Secondary | ICD-10-CM | POA: Diagnosis not present

## 2018-02-24 DIAGNOSIS — D235 Other benign neoplasm of skin of trunk: Secondary | ICD-10-CM | POA: Diagnosis not present

## 2018-02-24 DIAGNOSIS — Z0000 Encounter for general adult medical examination without abnormal findings: Secondary | ICD-10-CM

## 2018-02-24 DIAGNOSIS — D485 Neoplasm of uncertain behavior of skin: Secondary | ICD-10-CM | POA: Diagnosis not present

## 2018-02-24 LAB — LIPID PANEL
CHOL/HDL RATIO: 3
Cholesterol: 212 mg/dL — ABNORMAL HIGH (ref 0–200)
HDL: 61.8 mg/dL (ref >39.00)
LDL Cholesterol: 123 mg/dL — ABNORMAL HIGH (ref 0–99)
NonHDL: 150.6
TRIGLYCERIDES: 140 mg/dL (ref 0.0–149.0)
VLDL: 28 mg/dL (ref 0.0–40.0)

## 2018-02-24 LAB — CBC
HEMATOCRIT: 43.4 % (ref 36.0–46.0)
Hemoglobin: 14.8 g/dL (ref 12.0–15.0)
MCHC: 34.1 g/dL (ref 30.0–36.0)
MCV: 91.4 fl (ref 78.0–100.0)
Platelets: 310 10*3/uL (ref 150.0–400.0)
RBC: 4.75 Mil/uL (ref 3.87–5.11)
RDW: 12.8 % (ref 11.5–15.5)
WBC: 5.8 10*3/uL (ref 4.0–10.5)

## 2018-02-24 LAB — BASIC METABOLIC PANEL
BUN: 19 mg/dL (ref 6–23)
CALCIUM: 9.8 mg/dL (ref 8.4–10.5)
CHLORIDE: 103 meq/L (ref 96–112)
CO2: 29 mEq/L (ref 19–32)
CREATININE: 1.02 mg/dL (ref 0.40–1.20)
GFR: 57.59 mL/min — ABNORMAL LOW (ref >60.00)
Glucose, Bld: 100 mg/dL — ABNORMAL HIGH (ref 70–99)
Potassium: 4.3 mEq/L (ref 3.5–5.1)
Sodium: 141 mEq/L (ref 135–145)

## 2018-03-15 DIAGNOSIS — M9902 Segmental and somatic dysfunction of thoracic region: Secondary | ICD-10-CM | POA: Diagnosis not present

## 2018-03-15 DIAGNOSIS — M9901 Segmental and somatic dysfunction of cervical region: Secondary | ICD-10-CM | POA: Diagnosis not present

## 2018-03-15 DIAGNOSIS — M47812 Spondylosis without myelopathy or radiculopathy, cervical region: Secondary | ICD-10-CM | POA: Diagnosis not present

## 2018-03-15 DIAGNOSIS — M5386 Other specified dorsopathies, lumbar region: Secondary | ICD-10-CM | POA: Diagnosis not present

## 2018-03-17 DIAGNOSIS — M9901 Segmental and somatic dysfunction of cervical region: Secondary | ICD-10-CM | POA: Diagnosis not present

## 2018-03-17 DIAGNOSIS — M5386 Other specified dorsopathies, lumbar region: Secondary | ICD-10-CM | POA: Diagnosis not present

## 2018-03-17 DIAGNOSIS — M47812 Spondylosis without myelopathy or radiculopathy, cervical region: Secondary | ICD-10-CM | POA: Diagnosis not present

## 2018-03-17 DIAGNOSIS — M9902 Segmental and somatic dysfunction of thoracic region: Secondary | ICD-10-CM | POA: Diagnosis not present

## 2018-03-21 DIAGNOSIS — M5386 Other specified dorsopathies, lumbar region: Secondary | ICD-10-CM | POA: Diagnosis not present

## 2018-03-21 DIAGNOSIS — M9902 Segmental and somatic dysfunction of thoracic region: Secondary | ICD-10-CM | POA: Diagnosis not present

## 2018-03-21 DIAGNOSIS — M47812 Spondylosis without myelopathy or radiculopathy, cervical region: Secondary | ICD-10-CM | POA: Diagnosis not present

## 2018-03-21 DIAGNOSIS — M9901 Segmental and somatic dysfunction of cervical region: Secondary | ICD-10-CM | POA: Diagnosis not present

## 2018-03-23 DIAGNOSIS — M9902 Segmental and somatic dysfunction of thoracic region: Secondary | ICD-10-CM | POA: Diagnosis not present

## 2018-03-23 DIAGNOSIS — M9901 Segmental and somatic dysfunction of cervical region: Secondary | ICD-10-CM | POA: Diagnosis not present

## 2018-03-23 DIAGNOSIS — M47812 Spondylosis without myelopathy or radiculopathy, cervical region: Secondary | ICD-10-CM | POA: Diagnosis not present

## 2018-03-23 DIAGNOSIS — M5386 Other specified dorsopathies, lumbar region: Secondary | ICD-10-CM | POA: Diagnosis not present

## 2018-03-27 DIAGNOSIS — M9901 Segmental and somatic dysfunction of cervical region: Secondary | ICD-10-CM | POA: Diagnosis not present

## 2018-03-27 DIAGNOSIS — M5386 Other specified dorsopathies, lumbar region: Secondary | ICD-10-CM | POA: Diagnosis not present

## 2018-03-27 DIAGNOSIS — M9902 Segmental and somatic dysfunction of thoracic region: Secondary | ICD-10-CM | POA: Diagnosis not present

## 2018-03-27 DIAGNOSIS — M47812 Spondylosis without myelopathy or radiculopathy, cervical region: Secondary | ICD-10-CM | POA: Diagnosis not present

## 2018-03-30 DIAGNOSIS — M47812 Spondylosis without myelopathy or radiculopathy, cervical region: Secondary | ICD-10-CM | POA: Diagnosis not present

## 2018-03-30 DIAGNOSIS — M9902 Segmental and somatic dysfunction of thoracic region: Secondary | ICD-10-CM | POA: Diagnosis not present

## 2018-03-30 DIAGNOSIS — M9901 Segmental and somatic dysfunction of cervical region: Secondary | ICD-10-CM | POA: Diagnosis not present

## 2018-03-30 DIAGNOSIS — M5386 Other specified dorsopathies, lumbar region: Secondary | ICD-10-CM | POA: Diagnosis not present

## 2018-04-03 DIAGNOSIS — H2513 Age-related nuclear cataract, bilateral: Secondary | ICD-10-CM | POA: Diagnosis not present

## 2018-04-05 DIAGNOSIS — M9902 Segmental and somatic dysfunction of thoracic region: Secondary | ICD-10-CM | POA: Diagnosis not present

## 2018-04-05 DIAGNOSIS — M9903 Segmental and somatic dysfunction of lumbar region: Secondary | ICD-10-CM | POA: Diagnosis not present

## 2018-04-05 DIAGNOSIS — M9901 Segmental and somatic dysfunction of cervical region: Secondary | ICD-10-CM | POA: Diagnosis not present

## 2018-04-05 DIAGNOSIS — M47812 Spondylosis without myelopathy or radiculopathy, cervical region: Secondary | ICD-10-CM | POA: Diagnosis not present

## 2018-04-10 DIAGNOSIS — M9903 Segmental and somatic dysfunction of lumbar region: Secondary | ICD-10-CM | POA: Diagnosis not present

## 2018-04-10 DIAGNOSIS — M47812 Spondylosis without myelopathy or radiculopathy, cervical region: Secondary | ICD-10-CM | POA: Diagnosis not present

## 2018-04-10 DIAGNOSIS — M9902 Segmental and somatic dysfunction of thoracic region: Secondary | ICD-10-CM | POA: Diagnosis not present

## 2018-04-10 DIAGNOSIS — M9901 Segmental and somatic dysfunction of cervical region: Secondary | ICD-10-CM | POA: Diagnosis not present

## 2018-04-27 DIAGNOSIS — M9902 Segmental and somatic dysfunction of thoracic region: Secondary | ICD-10-CM | POA: Diagnosis not present

## 2018-04-27 DIAGNOSIS — M9903 Segmental and somatic dysfunction of lumbar region: Secondary | ICD-10-CM | POA: Diagnosis not present

## 2018-04-27 DIAGNOSIS — M47812 Spondylosis without myelopathy or radiculopathy, cervical region: Secondary | ICD-10-CM | POA: Diagnosis not present

## 2018-04-27 DIAGNOSIS — M9901 Segmental and somatic dysfunction of cervical region: Secondary | ICD-10-CM | POA: Diagnosis not present

## 2018-05-08 DIAGNOSIS — M9901 Segmental and somatic dysfunction of cervical region: Secondary | ICD-10-CM | POA: Diagnosis not present

## 2018-05-08 DIAGNOSIS — M47812 Spondylosis without myelopathy or radiculopathy, cervical region: Secondary | ICD-10-CM | POA: Diagnosis not present

## 2018-05-08 DIAGNOSIS — M9903 Segmental and somatic dysfunction of lumbar region: Secondary | ICD-10-CM | POA: Diagnosis not present

## 2018-05-08 DIAGNOSIS — M9902 Segmental and somatic dysfunction of thoracic region: Secondary | ICD-10-CM | POA: Diagnosis not present

## 2018-05-22 DIAGNOSIS — M9903 Segmental and somatic dysfunction of lumbar region: Secondary | ICD-10-CM | POA: Diagnosis not present

## 2018-05-22 DIAGNOSIS — M9901 Segmental and somatic dysfunction of cervical region: Secondary | ICD-10-CM | POA: Diagnosis not present

## 2018-05-22 DIAGNOSIS — M47812 Spondylosis without myelopathy or radiculopathy, cervical region: Secondary | ICD-10-CM | POA: Diagnosis not present

## 2018-05-22 DIAGNOSIS — M9902 Segmental and somatic dysfunction of thoracic region: Secondary | ICD-10-CM | POA: Diagnosis not present

## 2018-06-06 DIAGNOSIS — M9903 Segmental and somatic dysfunction of lumbar region: Secondary | ICD-10-CM | POA: Diagnosis not present

## 2018-06-06 DIAGNOSIS — M47812 Spondylosis without myelopathy or radiculopathy, cervical region: Secondary | ICD-10-CM | POA: Diagnosis not present

## 2018-06-06 DIAGNOSIS — M9902 Segmental and somatic dysfunction of thoracic region: Secondary | ICD-10-CM | POA: Diagnosis not present

## 2018-06-06 DIAGNOSIS — M9901 Segmental and somatic dysfunction of cervical region: Secondary | ICD-10-CM | POA: Diagnosis not present

## 2018-06-20 DIAGNOSIS — M47812 Spondylosis without myelopathy or radiculopathy, cervical region: Secondary | ICD-10-CM | POA: Diagnosis not present

## 2018-06-20 DIAGNOSIS — M9901 Segmental and somatic dysfunction of cervical region: Secondary | ICD-10-CM | POA: Diagnosis not present

## 2018-06-20 DIAGNOSIS — M9903 Segmental and somatic dysfunction of lumbar region: Secondary | ICD-10-CM | POA: Diagnosis not present

## 2018-06-20 DIAGNOSIS — M9902 Segmental and somatic dysfunction of thoracic region: Secondary | ICD-10-CM | POA: Diagnosis not present

## 2018-07-04 DIAGNOSIS — M9901 Segmental and somatic dysfunction of cervical region: Secondary | ICD-10-CM | POA: Diagnosis not present

## 2018-07-04 DIAGNOSIS — M9903 Segmental and somatic dysfunction of lumbar region: Secondary | ICD-10-CM | POA: Diagnosis not present

## 2018-07-04 DIAGNOSIS — M9902 Segmental and somatic dysfunction of thoracic region: Secondary | ICD-10-CM | POA: Diagnosis not present

## 2018-07-04 DIAGNOSIS — M47812 Spondylosis without myelopathy or radiculopathy, cervical region: Secondary | ICD-10-CM | POA: Diagnosis not present

## 2018-10-10 ENCOUNTER — Other Ambulatory Visit: Payer: Self-pay | Admitting: Family Medicine

## 2018-10-10 DIAGNOSIS — Z1231 Encounter for screening mammogram for malignant neoplasm of breast: Secondary | ICD-10-CM

## 2018-10-13 ENCOUNTER — Ambulatory Visit
Admission: RE | Admit: 2018-10-13 | Discharge: 2018-10-13 | Disposition: A | Discharge status: Home / Self Care | Payer: BC Managed Care – PPO | Source: Ambulatory Visit | Attending: Family Medicine | Admitting: Family Medicine

## 2018-10-13 ENCOUNTER — Other Ambulatory Visit: Payer: Self-pay

## 2018-10-13 DIAGNOSIS — Z1231 Encounter for screening mammogram for malignant neoplasm of breast: Secondary | ICD-10-CM | POA: Diagnosis not present

## 2018-11-02 ENCOUNTER — Telehealth: Payer: Self-pay | Admitting: Family Medicine

## 2018-11-02 NOTE — Telephone Encounter (Signed)
Medication Refill - Medication: pravastatin (PRAVACHOL) 40 MG tablet    Preferred Pharmacy (with phone number or street name):  [facility], [place] [phone] (Phone) [phone] (Fax)

## 2018-11-06 ENCOUNTER — Other Ambulatory Visit: Payer: Self-pay

## 2018-11-06 MED ORDER — PRAVASTATIN SODIUM 40 MG PO TABS: 40.0000 mg | ORAL_TABLET | Freq: Every day | ORAL | 0 refills | Status: DC

## 2018-11-06 NOTE — Telephone Encounter (Signed)
Pt called in and states pharmacy advised her she needs an appt for refills. She states Jaci Standard at [facility] told her he has been calling for 3 weeks to try and get her a refill and we won't respond. She does not want to make an appt, she only wants her refill. I advised her I see no communication from the pharmacy in her chart, nor any recommendations for an appt, so I am not sure where she received this information. She stated if she can't get her rx she will get her records and find another provider. Pt is very argumentative on the phone. She continually asks a question and then interrupts me when I am trying to answer her. She is very upset that she "just can't get her medicine".  Pt advised message being sent to the clinic. She wants an answer today.  Please advise.

## 2018-11-06 NOTE — Telephone Encounter (Signed)
Ok to refill pravastatin.  Pt not due for CPE until Oct 2020.  Of note: this is the first refill request seen by this provider for this pt.

## 2018-11-06 NOTE — Telephone Encounter (Signed)
Pt called this morning upset that her medication has not been refilled. Patient has not been seen in office since 02/14/2018 and she wants to get this refill. I did let patient know that she would need an appointment for this refill and they patient does not want an appointment for this refill. Patient is just wanting a refill for this medication with no appointment. Patient refuse to make an appointment with Dr. Volanda Napoleon for this refill. Please advise.

## 2018-11-06 NOTE — Telephone Encounter (Signed)
FYI: Pt refill sent and sent a note to pharmacy pt needs CPE for further refills

## 2018-11-07 ENCOUNTER — Other Ambulatory Visit: Payer: Self-pay | Admitting: *Deleted

## 2018-11-07 DIAGNOSIS — E785 Hyperlipidemia, unspecified: Secondary | ICD-10-CM

## 2018-11-07 MED ORDER — PRAVASTATIN SODIUM 40 MG PO TABS: 40.0000 mg | ORAL_TABLET | Freq: Every day | ORAL | 0 refills | Status: DC

## 2018-11-07 NOTE — Telephone Encounter (Signed)
Rx refill sent to pharmacy. 

## 2018-12-29 ENCOUNTER — Telehealth: Payer: Self-pay

## 2018-12-29 NOTE — Telephone Encounter (Signed)
Spoke with pt advised that our record show no Shingrix vaccination, pt advised to call her insurance to see if they cover the vaccine at her pharmacy

## 2018-12-29 NOTE — Telephone Encounter (Signed)
Copied from [place] [id number]. Topic: General - Other >> Dec 28, 2018  8:41 AM Megan Lutz A wrote: Reason for CRM: Pt called and is requesting to know if she has ever received her shingles shot. Please advise.

## 2019-01-25 DIAGNOSIS — D225 Melanocytic nevi of trunk: Secondary | ICD-10-CM | POA: Diagnosis not present

## 2019-01-25 DIAGNOSIS — D1801 Hemangioma of skin and subcutaneous tissue: Secondary | ICD-10-CM | POA: Diagnosis not present

## 2019-01-25 DIAGNOSIS — L738 Other specified follicular disorders: Secondary | ICD-10-CM | POA: Diagnosis not present

## 2019-01-25 DIAGNOSIS — L821 Other seborrheic keratosis: Secondary | ICD-10-CM | POA: Diagnosis not present

## 2019-01-30 DIAGNOSIS — Z23 Encounter for immunization: Secondary | ICD-10-CM | POA: Diagnosis not present

## 2019-02-15 ENCOUNTER — Other Ambulatory Visit: Payer: Self-pay | Admitting: Family Medicine

## 2019-02-15 DIAGNOSIS — E785 Hyperlipidemia, unspecified: Secondary | ICD-10-CM

## 2019-02-15 NOTE — Telephone Encounter (Signed)
Pt needs an appointment for further refills  

## 2019-02-16 ENCOUNTER — Other Ambulatory Visit: Payer: Self-pay | Admitting: Family Medicine

## 2019-02-16 DIAGNOSIS — E785 Hyperlipidemia, unspecified: Secondary | ICD-10-CM

## 2019-02-26 ENCOUNTER — Telehealth: Payer: Self-pay | Admitting: *Deleted

## 2019-02-26 NOTE — Telephone Encounter (Signed)
Copied from [place] [id number]. Topic: Appointment Scheduling - Transfer of Care >> Feb 26, 2019  3:50 PM Virl Axe D wrote: Pt is requesting to transfer FROM: [facility] Pt is requesting to transfer TO: [facility] Reason for requested transfer: [facility] [employer] is closer  [facility] to patient's current PCP (transferring FROM).

## 2019-02-27 NOTE — Telephone Encounter (Signed)
ok 

## 2019-02-27 NOTE — Telephone Encounter (Signed)
Transferred okay with Dr. Volanda Napoleon

## 2019-02-28 NOTE — Telephone Encounter (Signed)
OK with me. Sherri, can you please contact pt to schedule a transfer of care visit with me?

## 2019-02-28 NOTE — Telephone Encounter (Signed)
Done

## 2019-03-23 ENCOUNTER — Encounter: Payer: BC Managed Care – PPO | Admitting: Family

## 2019-03-26 ENCOUNTER — Telehealth: Payer: Self-pay | Admitting: Family Medicine

## 2019-03-26 ENCOUNTER — Other Ambulatory Visit: Payer: Self-pay

## 2019-03-26 DIAGNOSIS — E785 Hyperlipidemia, unspecified: Secondary | ICD-10-CM

## 2019-03-26 MED ORDER — PRAVASTATIN SODIUM 40 MG PO TABS: 40.0000 mg | ORAL_TABLET | Freq: Every day | ORAL | 0 refills | Status: DC

## 2019-03-26 NOTE — Telephone Encounter (Signed)
Pt has TOC appointment to a practice close to her and needs refill to get her through to her appointment, Rx sent to pt pharmacy.

## 2019-03-26 NOTE — Telephone Encounter (Signed)
pravastatin (PRAVACHOL) 40 MG tablet  Pt needed to get this refilled as she had had sch for last Friday on 12/4. A TOC with [facility] due to office closer. [facility] had to cancel the appt and the soonest resch is for 05/09/2019. These meds will now run out. Pls see will refill you call if issues [place], [place] [phone] (Phone) [phone] (Fax)

## 2019-04-03 DIAGNOSIS — H524 Presbyopia: Secondary | ICD-10-CM | POA: Diagnosis not present

## 2019-04-03 DIAGNOSIS — H2513 Age-related nuclear cataract, bilateral: Secondary | ICD-10-CM | POA: Diagnosis not present

## 2019-04-24 DIAGNOSIS — Z03818 Encounter for observation for suspected exposure to other biological agents ruled out: Secondary | ICD-10-CM | POA: Diagnosis not present

## 2019-04-27 ENCOUNTER — Other Ambulatory Visit: Payer: BC Managed Care – PPO

## 2019-05-09 ENCOUNTER — Encounter: Payer: BC Managed Care – PPO | Admitting: Family

## 2019-05-10 ENCOUNTER — Other Ambulatory Visit: Payer: Self-pay

## 2019-05-11 ENCOUNTER — Encounter: Payer: BC Managed Care – PPO | Admitting: Family

## 2019-05-11 ENCOUNTER — Ambulatory Visit (INDEPENDENT_AMBULATORY_CARE_PROVIDER_SITE_OTHER): Payer: BC Managed Care – PPO | Admitting: Family

## 2019-05-11 ENCOUNTER — Other Ambulatory Visit: Payer: Self-pay

## 2019-05-11 ENCOUNTER — Encounter: Payer: Self-pay | Admitting: Family

## 2019-05-11 VITALS — BP 112/70 | HR 73 | Temp 96.4°F | Resp 16 | Ht 60.5 in | Wt 151.0 lb

## 2019-05-11 DIAGNOSIS — E785 Hyperlipidemia, unspecified: Secondary | ICD-10-CM | POA: Diagnosis not present

## 2019-05-11 DIAGNOSIS — D649 Anemia, unspecified: Secondary | ICD-10-CM | POA: Diagnosis not present

## 2019-05-11 DIAGNOSIS — M8580 Other specified disorders of bone density and structure, unspecified site: Secondary | ICD-10-CM | POA: Diagnosis not present

## 2019-05-11 DIAGNOSIS — E559 Vitamin D deficiency, unspecified: Secondary | ICD-10-CM | POA: Insufficient documentation

## 2019-05-11 DIAGNOSIS — Z87442 Personal history of urinary calculi: Secondary | ICD-10-CM | POA: Insufficient documentation

## 2019-05-11 MED ORDER — CALCIUM CARBONATE-VITAMIN D 600-400 MG-UNIT PO TABS: 1.0000 | ORAL_TABLET | Freq: Two times a day (BID) | ORAL | Status: DC

## 2019-05-11 NOTE — Patient Instructions (Signed)
Below are two ways to schedule a Covid-19 Vaccine:  Please visit [email or web address] to register or call [phone]  Or call:  [place] Covid-19 vaccine scheduling at [phone]  

## 2019-05-11 NOTE — Progress Notes (Signed)
Subjective:    Patient ID: Megan Lutz, female    DOB: [birth year], 69 y.o.   MRN: [id number]  HPI   Patient is a 69 yr old female who presents today to establish care. She was previously followed by a provider at another one of our sites.   Pmhx is significant for the following:  Anemia- had anemia back in 2011. Lab Results  Component Value Date   WBC 5.8 02/24/2018   HGB 14.8 02/24/2018   HCT 43.4 02/24/2018   MCV 91.4 02/24/2018   PLT 310.0 02/24/2018   Diverticular disease- noted on colonoscopy- denies hx of diverticulitis.  Vit D deficiency- taking 800 iu of vitamin D she thinks.   Hyperlipidemia- maintained on pravastatin 40mg .  Lab Results  Component Value Date   CHOL 212 (H) 02/24/2018   HDL 61.80 02/24/2018   LDLCALC 123 (H) 02/24/2018   LDLDIRECT 162.0 02/19/2010   TRIG 140.0 02/24/2018   CHOLHDL 3 02/24/2018   Osteopenia- had bone density 12/18:  Major Osteoporotic Fracture: 10.1%.   Hip Fracture:                1.3%  Hx of kidney stone- has had one kidney stone she was hospitalized.   Review of Systems See HPI  Past Medical History:  Diagnosis Date  . Allergy   . ANEMIA 09/25/2009  . Diverticular disease 2013  . Hemorrhoid   . HYPERLIPIDEMIA 12/07/2007  . Kidney stone   . OSTEOPENIA 12/07/2007  . PERIMENOPAUSAL SYNDROME 12/10/2008     Social History   Socioeconomic History  . Marital status: Divorced    Spouse name: Not on file  . Number of children: Not on file  . Years of education: Not on file  . Highest education level: Not on file  Occupational History  . Not on file  Tobacco Use  . Smoking status: Never Smoker  . Smokeless tobacco: Never Used  Substance and Sexual Activity  . Alcohol use: Yes    Comment: occ  . Drug use: No  . Sexual activity: Not on file  Other Topics Concern  . 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    Son and daughter- both live locally- has a  65 yr old granddaughter   Divorced- 1998, Engaged (viance lives in [place])   Social Determinants of Health   Financial Resource Strain:   . Difficulty of Paying Living Expenses: Not on file  Food Insecurity:   . Worried About Charity fundraiser in the Last Year: Not on file  . Ran Out of Food in the Last Year: Not on file  Transportation Needs:   . Lack of Transportation (Medical): Not on file  . Lack of Transportation (Non-Medical): Not on file  Physical Activity:   . Days of Exercise per Week: Not on file  . Minutes of Exercise per Session: Not on file  Stress:   . Feeling of Stress : Not on file  Social Connections:   . Frequency of Communication with Friends and Family: Not on file  . Frequency of Social Gatherings with Friends and Family: Not on file  . Attends Religious Services: Not on file  . Active Member of Clubs or Organizations: Not on file  . Attends Archivist Meetings: Not on file  . Marital Status: Not on file  Intimate Partner Violence:   . Fear of Current or Ex-Partner: Not on file  .  Emotionally Abused: Not on file  . Physically Abused: Not on file  . Sexually Abused: Not on file    Past Surgical History:  Procedure Laterality Date  . COLONOSCOPY    . CYSTOSCOPY     ?2000  . FRACTURE SURGERY  2000   finger-pins placed. "hard to wake up from anesthesia" per pt  . POLYPECTOMY    . TONSILLECTOMY      Family History  Problem Relation Age of Onset  . Cancer Mother   . Cancer Father   . Colon cancer Father 21    Allergies  Allergen Reactions  . Codeine Phosphate Nausea And Vomiting    Current Outpatient Medications on File Prior to Visit  Medication Sig Dispense Refill  . aspirin 81 MG tablet Take 81 mg by mouth daily.      . pravastatin (PRAVACHOL) 40 MG tablet Take 1 tablet (40 mg total) by mouth daily. 60 tablet 0   Current Facility-Administered Medications on File Prior to Visit  Medication Dose Route Frequency Provider Last Rate  Last Admin  . 0.9 %  sodium chloride infusion  500 mL Intravenous Once Nelida Meuse III, MD        BP 112/70 (BP Location: Right Arm, Patient Position: Sitting, Cuff Size: Normal)   Pulse 73   Temp (!) 96.4 F (35.8 C) (Temporal)   Resp 16   Ht 5' 0.5" (1.537 m)   Wt 151 lb (68.5 kg)   SpO2 98%   BMI 29.00 kg/m       Objective:   Physical Exam HENT:     Head: Normocephalic and atraumatic.     Right Ear: Tympanic membrane and ear canal normal.     Left Ear: Tympanic membrane and ear canal normal.  Cardiovascular:     Rate and Rhythm: Normal rate.     Heart sounds: No murmur.  Pulmonary:     Effort: Pulmonary effort is normal.  Musculoskeletal:     Cervical back: No tenderness.  Lymphadenopathy:     Cervical: No cervical adenopathy.  Skin:    General: Skin is warm and dry.  Neurological:     Mental Status: She is alert.  Psychiatric:        Mood and Affect: Mood normal.        Behavior: Behavior normal.                Assessment & Plan:   Hyperlipidemia- tolerating statin, obtain follow up CMET and Lipid panel.  Vit D- deficiency- check follow up vit D level.  Osteopenia- add caltrate + D  Hx of kidney stone- stable. No recent symptoms.  Hx of Anemia- last cbc normal.  This visit occurred during the SARS-CoV-2 public health emergency.  Safety protocols were in place, including screening questions prior to the visit, additional usage of staff PPE, and extensive cleaning of exam room while observing appropriate contact time as indicated for disinfecting solutions.       Assessment & Plan:    30 minutes spent on today's visit

## 2019-05-30 ENCOUNTER — Other Ambulatory Visit: Payer: Self-pay | Admitting: Family Medicine

## 2019-05-30 DIAGNOSIS — E785 Hyperlipidemia, unspecified: Secondary | ICD-10-CM

## 2019-06-01 ENCOUNTER — Telehealth: Payer: Self-pay | Admitting: Family

## 2019-06-01 NOTE — Telephone Encounter (Signed)
New patient on 05-11-19. Please advise if ok to fill pravastatin 40 mg

## 2019-06-01 NOTE — Telephone Encounter (Signed)
Tasha from Dr. Lockie Pares office requested that pt call us and [place] and let us know   Stated she faxed over a release for patient meds to be filled my [facility]   Medication is pravastatin (PRAVACHOL) 40 MG tablet   Pharm  [facility] on [address]

## 2019-06-01 NOTE — Telephone Encounter (Signed)
Yes, ok to give a 90 day supply please, but pt needs a lab appointment please- orders are already in.

## 2019-06-01 NOTE — Telephone Encounter (Signed)
Lab scheduled for Wednesday 02/17

## 2019-06-04 ENCOUNTER — Other Ambulatory Visit: Payer: Self-pay

## 2019-06-04 DIAGNOSIS — E785 Hyperlipidemia, unspecified: Secondary | ICD-10-CM

## 2019-06-04 MED ORDER — PRAVASTATIN SODIUM 40 MG PO TABS: 40.0000 mg | ORAL_TABLET | Freq: Every day | ORAL | 0 refills | Status: DC

## 2019-06-04 NOTE — Telephone Encounter (Signed)
Rx sent in for patient

## 2019-06-04 NOTE — Telephone Encounter (Signed)
Pt called stating pharmacy has not received RX. She will be out tomorrow. Please send 90 day to [facility]

## 2019-06-06 ENCOUNTER — Other Ambulatory Visit: Payer: Self-pay

## 2019-06-06 ENCOUNTER — Other Ambulatory Visit (INDEPENDENT_AMBULATORY_CARE_PROVIDER_SITE_OTHER): Payer: BC Managed Care – PPO

## 2019-06-06 DIAGNOSIS — E559 Vitamin D deficiency, unspecified: Secondary | ICD-10-CM

## 2019-06-06 DIAGNOSIS — E785 Hyperlipidemia, unspecified: Secondary | ICD-10-CM | POA: Diagnosis not present

## 2019-06-06 LAB — LIPID PANEL
Cholesterol: 200 mg/dL (ref 0–200)
HDL: 57.9 mg/dL (ref >39.00)
LDL Cholesterol: 113 mg/dL — ABNORMAL HIGH (ref 0–99)
NonHDL: 142.46
Total CHOL/HDL Ratio: 3
Triglycerides: 147 mg/dL (ref 0.0–149.0)
VLDL: 29.4 mg/dL (ref 0.0–40.0)

## 2019-06-06 LAB — COMPREHENSIVE METABOLIC PANEL
ALT: 23 U/L (ref 0–35)
AST: 23 U/L (ref 0–37)
Albumin: 4.3 g/dL (ref 3.5–5.2)
Alkaline Phosphatase: 70 U/L (ref 39–117)
BUN: 17 mg/dL (ref 6–23)
CO2: 28 mEq/L (ref 19–32)
Calcium: 9.4 mg/dL (ref 8.4–10.5)
Chloride: 105 mEq/L (ref 96–112)
Creatinine, Ser: 1.01 mg/dL (ref 0.40–1.20)
GFR: 54.59 mL/min — ABNORMAL LOW (ref >60.00)
Glucose, Bld: 106 mg/dL — ABNORMAL HIGH (ref 70–99)
Potassium: 4.4 mEq/L (ref 3.5–5.1)
Sodium: 140 mEq/L (ref 135–145)
Total Bilirubin: 0.5 mg/dL (ref 0.2–1.2)
Total Protein: 6.9 g/dL (ref 6.0–8.3)

## 2019-06-06 NOTE — Addendum Note (Signed)
Addended by: Kelle Darting A on: 06/06/2019 07:39 AM   Modules accepted: Orders

## 2019-06-08 ENCOUNTER — Encounter: Payer: BC Managed Care – PPO | Admitting: Family

## 2019-06-09 LAB — VITAMIN D 1,25 DIHYDROXY
Vitamin D 1, 25 (OH)2 Total: 38 pg/mL (ref 18–72)
Vitamin D2 1, 25 (OH)2: <8 pg/mL
Vitamin D3 1, 25 (OH)2: 38 pg/mL

## 2019-06-10 ENCOUNTER — Encounter: Payer: Self-pay | Admitting: Family

## 2019-06-11 ENCOUNTER — Ambulatory Visit (INDEPENDENT_AMBULATORY_CARE_PROVIDER_SITE_OTHER): Payer: BC Managed Care – PPO | Admitting: Family

## 2019-06-11 ENCOUNTER — Other Ambulatory Visit: Payer: Self-pay

## 2019-06-11 ENCOUNTER — Other Ambulatory Visit (HOSPITAL_COMMUNITY)
Admission: RE | Admit: 2019-06-11 | Discharge: 2019-06-11 | Disposition: A | Discharge status: Home / Self Care | Payer: BC Managed Care – PPO | Source: Ambulatory Visit | Attending: Family | Admitting: Family

## 2019-06-11 ENCOUNTER — Encounter: Payer: Self-pay | Admitting: Family

## 2019-06-11 VITALS — BP 133/80 | HR 71 | Temp 96.7°F | Resp 16 | Ht 65.0 in | Wt 152.0 lb

## 2019-06-11 DIAGNOSIS — Z01419 Encounter for gynecological examination (general) (routine) without abnormal findings: Secondary | ICD-10-CM

## 2019-06-11 DIAGNOSIS — Z23 Encounter for immunization: Secondary | ICD-10-CM

## 2019-06-11 DIAGNOSIS — Z0000 Encounter for general adult medical examination without abnormal findings: Secondary | ICD-10-CM | POA: Diagnosis not present

## 2019-06-11 LAB — CBC WITH DIFFERENTIAL/PLATELET
Basophils Absolute: 0.1 10*3/uL (ref 0.0–0.1)
Basophils Relative: 0.7 % (ref 0.0–3.0)
Eosinophils Absolute: 0.1 10*3/uL (ref 0.0–0.7)
Eosinophils Relative: 0.9 % (ref 0.0–5.0)
HCT: 43.1 % (ref 36.0–46.0)
Hemoglobin: 14.4 g/dL (ref 12.0–15.0)
Lymphocytes Relative: 33.8 % (ref 12.0–46.0)
Lymphs Abs: 2.4 10*3/uL (ref 0.7–4.0)
MCHC: 33.3 g/dL (ref 30.0–36.0)
MCV: 91.7 fl (ref 78.0–100.0)
Monocytes Absolute: 0.6 10*3/uL (ref 0.1–1.0)
Monocytes Relative: 8.2 % (ref 3.0–12.0)
Neutro Abs: 4 10*3/uL (ref 1.4–7.7)
Neutrophils Relative %: 56.4 % (ref 43.0–77.0)
Platelets: 293 10*3/uL (ref 150.0–400.0)
RBC: 4.71 Mil/uL (ref 3.87–5.11)
RDW: 12.7 % (ref 11.5–15.5)
WBC: 7.1 10*3/uL (ref 4.0–10.5)

## 2019-06-11 LAB — TSH: TSH: 3.32 u[IU]/mL (ref 0.35–4.50)

## 2019-06-11 NOTE — Patient Instructions (Signed)
Please complete lab work prior to leaving.   

## 2019-06-11 NOTE — Progress Notes (Signed)
Subjective:    Patient ID: Megan Lutz, female    DOB: [birth year], 69 y.o.   MRN: [id number]  HPI  Patient presents today for complete physical.  Immunizations: tdap 2015, due for pneumovax 23, shingrix today  Diet: fair diet Wt Readings from Last 3 Encounters:  06/11/19 152 lb (68.9 kg)  05/11/19 151 lb (68.5 kg)  02/08/18 152 lb (68.9 kg)  Exercise: walks some Colonoscopy: 2018 Dexa: 04/01/17 Pap Smear:  See assessment/plan Mammogram: 6/20  C/o feeling forgetful sometimes. Wonders if this is normal.   Review of Systems  Constitutional: Negative for unexpected weight change.  HENT: Negative for hearing loss and rhinorrhea.   Eyes: Negative for visual disturbance.  Respiratory: Negative for cough.   Cardiovascular: Negative for chest pain.  Gastrointestinal: Negative for constipation and diarrhea.       Notes occasional dysphagia has not occurred since 3 weeks, describes like a "spasm" in her esophagus.  Has happened only 3 times ever over the last several years.   Genitourinary: Negative for dysuria and frequency.  Skin: Negative for rash.  Neurological: Negative for headaches.  Hematological: Negative for adenopathy.  Psychiatric/Behavioral:       Denies depression/anxiety       Past Medical History:  Diagnosis Date  . Allergy   . ANEMIA 09/25/2009  . Diverticular disease 2013  . Hemorrhoid   . HYPERLIPIDEMIA 12/07/2007  . Kidney stone   . OSTEOPENIA 12/07/2007  . PERIMENOPAUSAL SYNDROME 12/10/2008     Social History   Socioeconomic History  . Marital status: Divorced    Spouse name: Not on file  . Number of children: Not on file  . Years of education: Not on file  . Highest education level: Not on file  Occupational History  . Not on file  Tobacco Use  . Smoking status: Never Smoker  . Smokeless tobacco: Never Used  Substance and Sexual Activity  . Alcohol use: Yes    Comment: occ  . Drug use: No  . Sexual activity: Not on file  Other  Topics Concern  . 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    Son and daughter- both live locally- has a 61 yr old granddaughter   Divorced- 1998, Engaged (viance lives in [place])   Social Determinants of Health   Financial Resource Strain:   . Difficulty of Paying Living Expenses: Not on file  Food Insecurity:   . Worried About Charity fundraiser in the Last Year: Not on file  . Ran Out of Food in the Last Year: Not on file  Transportation Needs:   . Lack of Transportation (Medical): Not on file  . Lack of Transportation (Non-Medical): Not on file  Physical Activity:   . Days of Exercise per Week: Not on file  . Minutes of Exercise per Session: Not on file  Stress:   . Feeling of Stress : Not on file  Social Connections:   . Frequency of Communication with Friends and Family: Not on file  . Frequency of Social Gatherings with Friends and Family: Not on file  . Attends Religious Services: Not on file  . Active Member of Clubs or Organizations: Not on file  . Attends Archivist Meetings: Not on file  . Marital Status: Not on file  Intimate Partner Violence:   . Fear of Current or Ex-Partner: Not on file  . Emotionally Abused: Not on file  .  Physically Abused: Not on file  . Sexually Abused: Not on file    Past Surgical History:  Procedure Laterality Date  . COLONOSCOPY    . CYSTOSCOPY     ?2000  . FRACTURE SURGERY  2000   finger-pins placed. "hard to wake up from anesthesia" per pt  . POLYPECTOMY    . TONSILLECTOMY      Family History  Problem Relation Age of Onset  . Cancer Mother   . Cancer Father   . Colon cancer Father 40    Allergies  Allergen Reactions  . Codeine Phosphate Nausea And Vomiting    Current Outpatient Medications on File Prior to Visit  Medication Sig Dispense Refill  . aspirin 81 MG tablet Take 81 mg by mouth daily.      . Calcium Carbonate-Vitamin D 600-400 MG-UNIT tablet  Take 1 tablet by mouth 2 (two) times daily.    . pravastatin (PRAVACHOL) 40 MG tablet Take 1 tablet (40 mg total) by mouth daily. 90 tablet 0   Current Facility-Administered Medications on File Prior to Visit  Medication Dose Route Frequency Provider Last Rate Last Admin  . 0.9 %  sodium chloride infusion  500 mL Intravenous Once Danis, Estill Cotta III, MD        BP 133/80 (BP Location: Right Arm, Patient Position: Sitting, Cuff Size: Small)   Pulse 71   Temp (!) 96.7 F (35.9 C) (Temporal)   Resp 16   Ht 5\' 5"  (1.651 m)   Wt 152 lb (68.9 kg)   SpO2 98%   BMI 25.29 kg/m    Objective:   Physical Exam  Physical Exam  Constitutional: She is oriented to person, place, and time. She appears well-developed and well-nourished. No distress.  HENT:  Head: Normocephalic and atraumatic.  Right Ear: Tympanic membrane and ear canal normal.  Left Ear: Tympanic membrane and ear canal normal.  Mouth/Throat: not examined Eyes: Pupils are equal, round, and reactive to light. No scleral icterus.  Neck: Normal range of motion. No thyromegaly present.  Cardiovascular: Normal rate and regular rhythm.   No murmur heard. Pulmonary/Chest: Effort normal and breath sounds normal. No respiratory distress. He has no wheezes. She has no rales. She exhibits no tenderness.  Abdominal: Soft. Bowel sounds are normal. She exhibits no distension and no mass. There is no tenderness. There is no rebound and no guarding.  Musculoskeletal: She exhibits no edema.  Lymphadenopathy:    She has no cervical adenopathy.  Neurological: She is alert and oriented to person, place, and time. She has normal patellar reflexes. She exhibits normal muscle tone. Coordination normal.  Skin: Skin is warm and dry.  Psychiatric: She has a normal mood and affect. Her behavior is normal. Judgment and thought content normal.  Breasts: Examined lying Right: Without masses, retractions, discharge or axillary adenopathy.  Left: Without  masses, retractions, discharge or axillary adenopathy.  Inguinal/mons: Normal without inguinal adenopathy  External genitalia: Normal  BUS/Urethra/Skene's glands: Normal  Bladder: Normal  Vagina: Normal  Cervix: Normal  Uterus: normal in size, shape and contour. Midline and mobile  Adnexa/parametria:  Rt: Without masses or tenderness.  Lt: Without masses or tenderness.  Anus and perineum: Normal            Assessment & Plan:   Preventative care- discussed healthy diet and exercise. Pap performed today at pt request.  We discussed not typically indicated after the age of 55. Mammogram, colo up to date. Will obtain routine lab work and  send for dexa. Shingrix #1 today and pneumovax 23.  Plan to do MMSE when she returns for Shingrix #2  This visit occurred during the SARS-CoV-2 public health emergency.  Safety protocols were in place, including screening questions prior to the visit, additional usage of staff PPE, and extensive cleaning of exam room while observing appropriate contact time as indicated for disinfecting solutions.       Assessment & Plan:

## 2019-06-12 LAB — HEPATIC FUNCTION PANEL
ALT: 23 U/L (ref 0–35)
AST: 23 U/L (ref 0–37)
Albumin: 4.6 g/dL (ref 3.5–5.2)
Alkaline Phosphatase: 81 U/L (ref 39–117)
Bilirubin, Direct: 0.1 mg/dL (ref 0.0–0.3)
Total Bilirubin: 0.4 mg/dL (ref 0.2–1.2)
Total Protein: 7.3 g/dL (ref 6.0–8.3)

## 2019-06-13 LAB — CYTOLOGY - PAP
Comment: NEGATIVE
Diagnosis: NEGATIVE
High risk HPV: NEGATIVE

## 2019-06-14 ENCOUNTER — Encounter: Payer: Self-pay | Admitting: Family

## 2019-06-14 NOTE — Progress Notes (Signed)
Mailed out to pt 

## 2019-06-14 NOTE — Progress Notes (Signed)
Mailed out to patient 

## 2019-06-15 ENCOUNTER — Telehealth: Payer: Self-pay

## 2019-06-15 NOTE — Telephone Encounter (Signed)
Patient needs to talk to [facility] or the nurse about some concerns about taking her Covid-19 shot. Please follow up with the patient at [phone]  Thanks,

## 2019-06-19 NOTE — Telephone Encounter (Signed)
Patient wanted to know if she can get covid series while having Shingles, had first Shingles on 06/11/2019. Patient advised per [name] covid immuizztion takes priority and she can start 2 weeks from first Shingles and have the second shingles at least 14 days after second covid.

## 2019-06-26 ENCOUNTER — Ambulatory Visit: Payer: BC Managed Care – PPO

## 2019-07-09 ENCOUNTER — Ambulatory Visit: Payer: BC Managed Care – PPO | Attending: Internal Medicine

## 2019-07-09 DIAGNOSIS — Z23 Encounter for immunization: Secondary | ICD-10-CM

## 2019-07-09 NOTE — Progress Notes (Signed)
   Covid-19 Vaccination Clinic  Name:  Megan Lutz    MRN: [id number] DOB: 1950-09-29  07/09/2019  [name] was observed post Covid-19 immunization for 15 minutes without incident. She was provided with Vaccine Information Sheet and instruction to access the V-Safe system.   [name] was instructed to call 911 with any severe reactions post vaccine: Marland Kitchen Difficulty breathing  . Swelling of face and throat  . A fast heartbeat  . A bad rash all over body  . Dizziness and weakness   Immunizations Administered    Name Date Dose VIS Date Route   Pfizer COVID-19 Vaccine 07/09/2019 11:59 AM 0.3 mL 03/30/2019 Intramuscular   Manufacturer: [place]   Lot: [id number]   [place]: [id number]

## 2019-08-01 ENCOUNTER — Ambulatory Visit: Payer: BC Managed Care – PPO | Attending: Internal Medicine

## 2019-08-01 DIAGNOSIS — Z23 Encounter for immunization: Secondary | ICD-10-CM

## 2019-08-01 NOTE — Progress Notes (Signed)
   Covid-19 Vaccination Clinic  Name:  Megan Lutz    MRN: [id number] DOB: 1950-06-13  08/01/2019  Megan Lutz was observed post Covid-19 immunization for 15 minutes without incident. She was provided with Vaccine Information Sheet and instruction to access the V-Safe system.   Megan Lutz was instructed to call 911 with any severe reactions post vaccine: Marland Kitchen Difficulty breathing  . Swelling of face and throat  . A fast heartbeat  . A bad rash all over body  . Dizziness and weakness   Immunizations Administered    Name Date Dose VIS Date Route   Pfizer COVID-19 Vaccine 08/01/2019 11:58 AM 0.3 mL 03/30/2019 Intramuscular   Manufacturer: [place]   Lot: [id number]   [place]: [id number]

## 2019-08-10 ENCOUNTER — Ambulatory Visit: Payer: BC Managed Care – PPO | Admitting: Family

## 2019-09-05 IMAGING — MG DIGITAL SCREENING BILATERAL MAMMOGRAM WITH CAD
4 series · 4 of 4 positions shown · non-contrast
Comparison: Previous exam(s).

CLINICAL DATA: Screening.

EXAM:
DIGITAL SCREENING BILATERAL MAMMOGRAM WITH CAD

[R MLO]
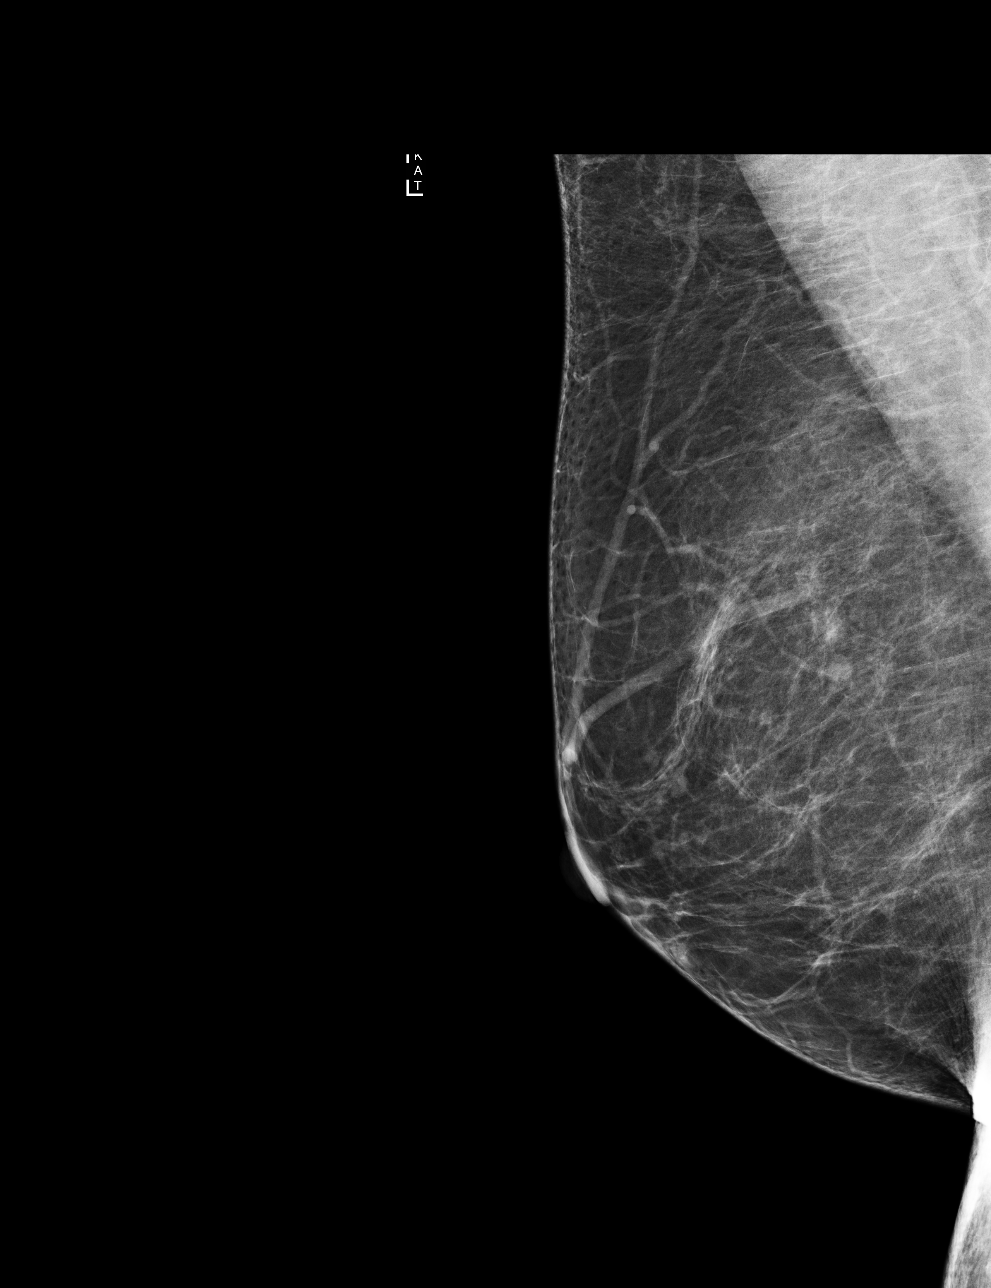

[R CC]
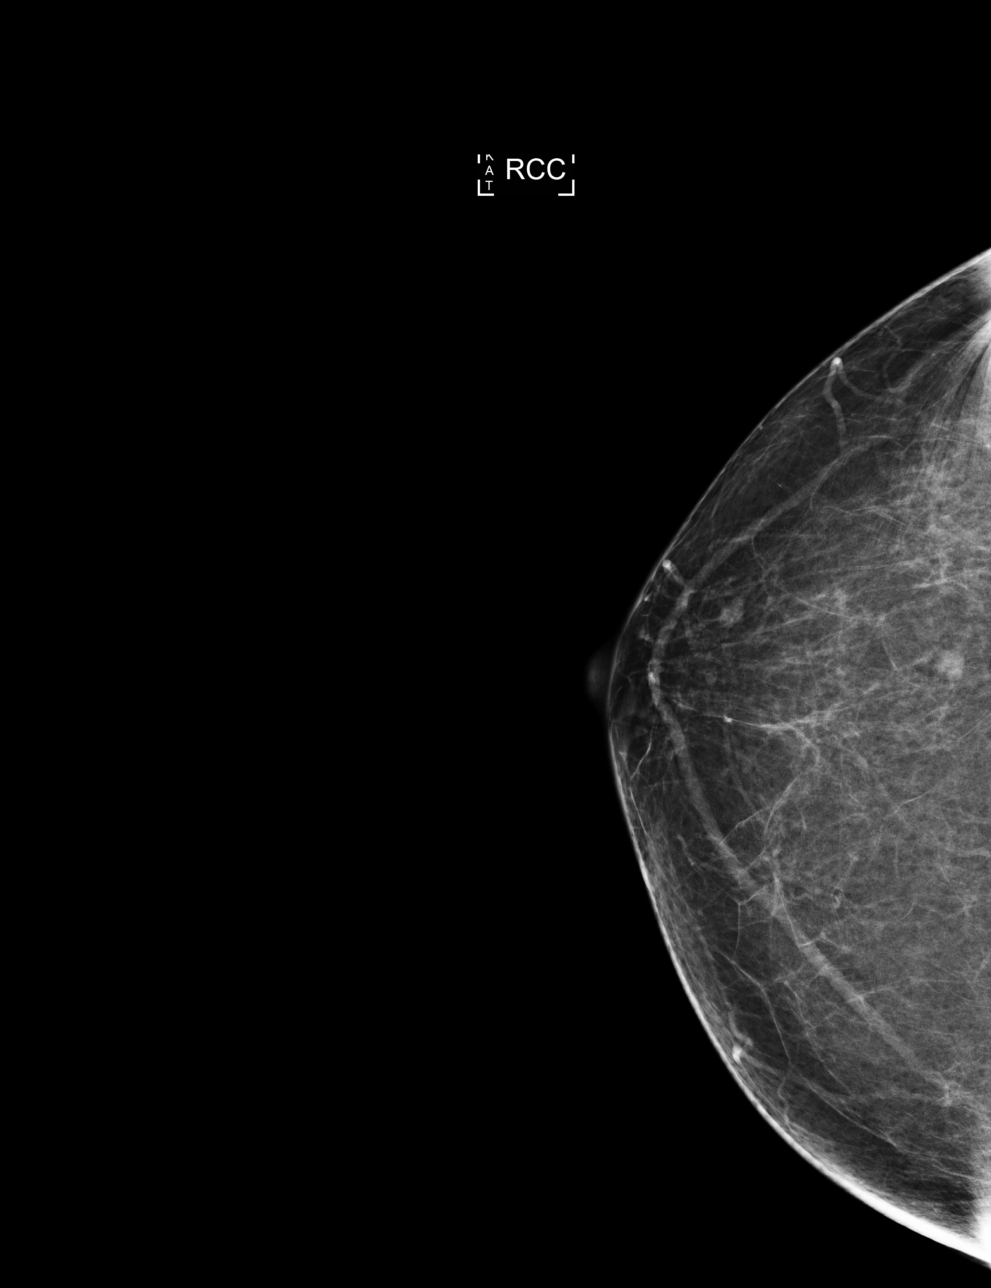

[L MLO]
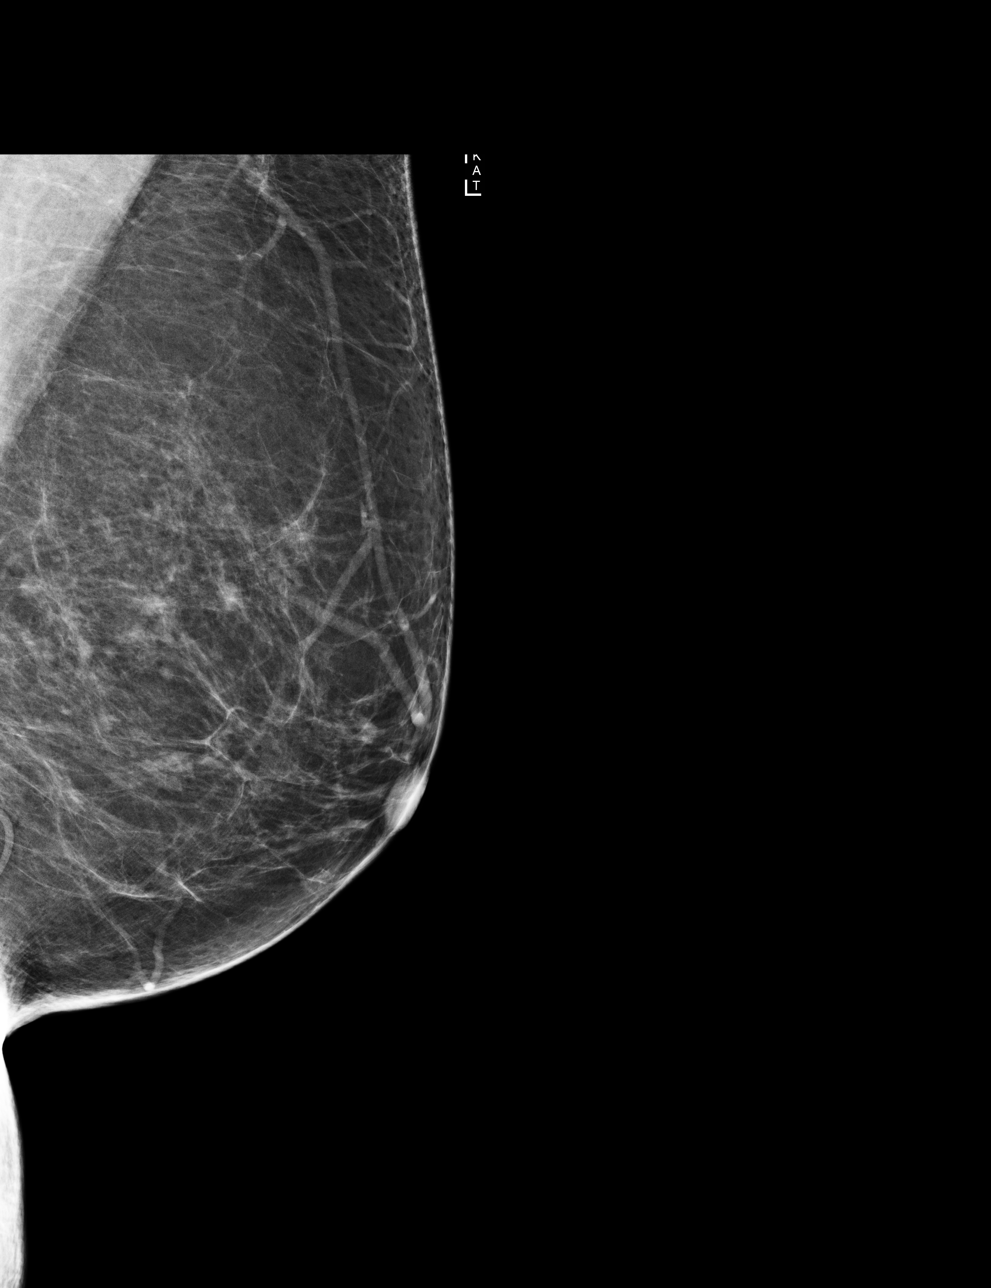

[L CC]
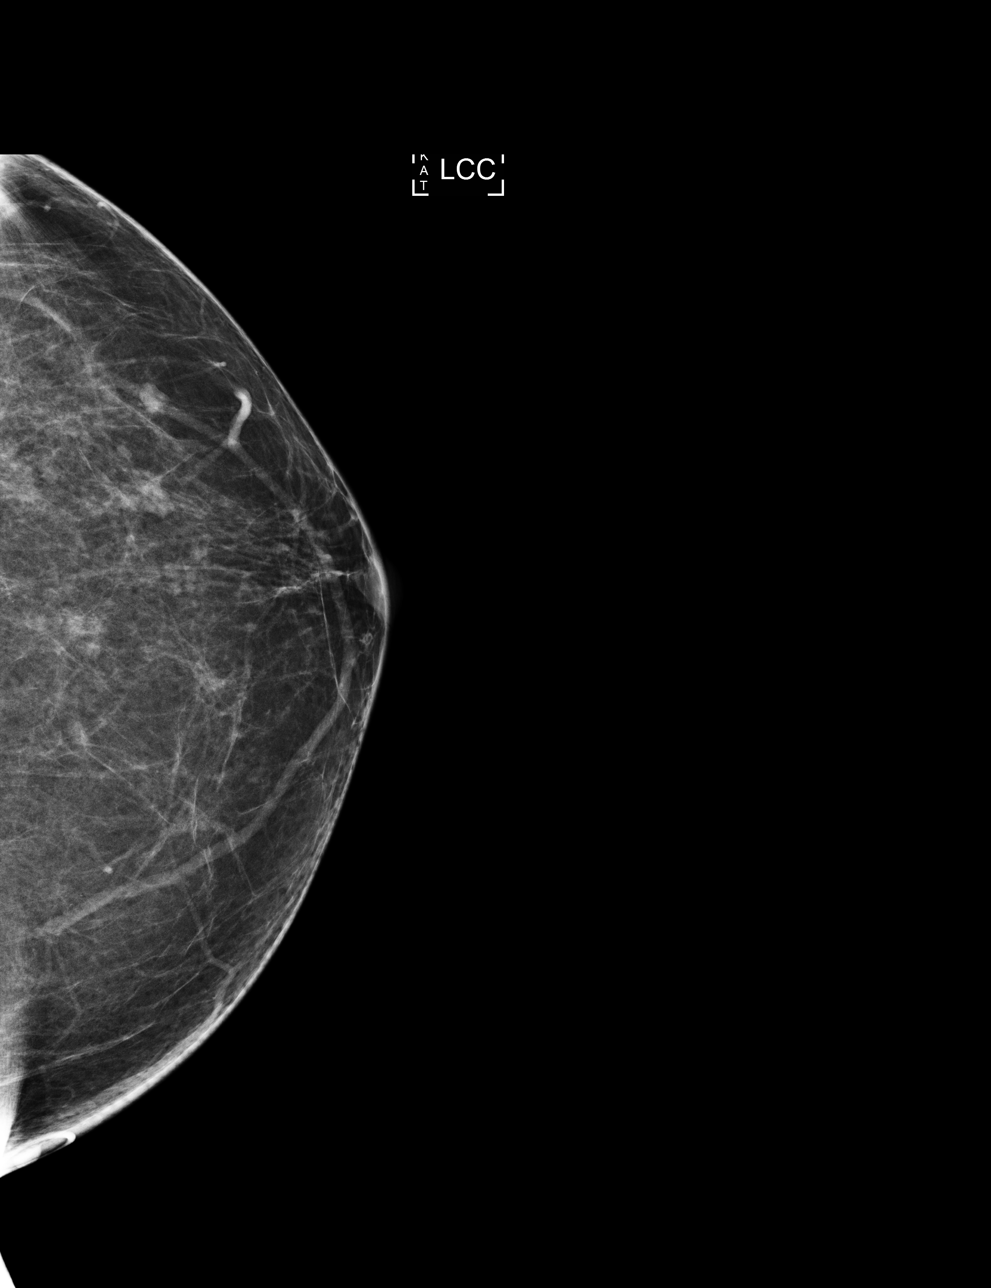

[4 of 4 positions shown; findings below may reference images not displayed]

ACR Breast Density Category b: There are scattered areas of
fibroglandular density.
FINDINGS: There are no findings suspicious for malignancy. Images were
processed with CAD.
IMPRESSION: No mammographic evidence of malignancy. A result letter of this
screening mammogram will be mailed directly to the patient.

RECOMMENDATION:
Screening mammogram in one year. (Code:AS-G-LCT)

BI-RADS CATEGORY  1: Negative.

## 2019-09-07 ENCOUNTER — Ambulatory Visit: Payer: BC Managed Care – PPO | Admitting: Family

## 2019-09-10 ENCOUNTER — Other Ambulatory Visit: Payer: Self-pay | Admitting: Family

## 2019-09-10 DIAGNOSIS — E785 Hyperlipidemia, unspecified: Secondary | ICD-10-CM

## 2019-09-12 ENCOUNTER — Other Ambulatory Visit: Payer: Self-pay | Admitting: Family

## 2019-09-12 DIAGNOSIS — E785 Hyperlipidemia, unspecified: Secondary | ICD-10-CM

## 2019-09-12 MED ORDER — PRAVASTATIN SODIUM 40 MG PO TABS: 40.0000 mg | ORAL_TABLET | Freq: Every day | ORAL | 0 refills | Status: DC

## 2019-09-12 NOTE — Addendum Note (Signed)
Addended by: [name] on: 09/12/2019 11:36 AM   Modules accepted: Orders

## 2019-10-15 ENCOUNTER — Encounter: Payer: Self-pay | Admitting: Family

## 2019-10-15 ENCOUNTER — Ambulatory Visit (INDEPENDENT_AMBULATORY_CARE_PROVIDER_SITE_OTHER): Payer: BC Managed Care – PPO | Admitting: Family

## 2019-10-15 ENCOUNTER — Other Ambulatory Visit: Payer: Self-pay

## 2019-10-15 VITALS — BP 126/70 | HR 72 | Resp 16 | Ht 65.0 in | Wt 149.0 lb

## 2019-10-15 DIAGNOSIS — R413 Other amnesia: Secondary | ICD-10-CM | POA: Diagnosis not present

## 2019-10-15 DIAGNOSIS — Z23 Encounter for immunization: Secondary | ICD-10-CM | POA: Diagnosis not present

## 2019-10-15 NOTE — Addendum Note (Signed)
Addended by: Jiles Prows on: 10/15/2019 08:59 AM   Modules accepted: Orders

## 2019-10-15 NOTE — Patient Instructions (Signed)
Please consider retiring to help reduce your stress.

## 2019-10-15 NOTE — Progress Notes (Signed)
Subjective:    Patient ID: Megan Lutz, female    DOB: 07-20-1950, 69 y.o.   MRN: [id number]  HPI  Patient is a 69 yr old female who presents today to discuss concerns about memory loss.  Reports that she has a lot of distractions at work.  Then she has trouble re-focusing. No issues at home with memory. She is interested in retiring.    She is due for shingrix #2 today. Review of Systems See HPI  Past Medical History:  Diagnosis Date  . Allergy   . ANEMIA 09/25/2009  . Diverticular disease 2013  . Hemorrhoid   . HYPERLIPIDEMIA 12/07/2007  . Kidney stone   . OSTEOPENIA 12/07/2007  . PERIMENOPAUSAL SYNDROME 12/10/2008     Social History   Socioeconomic History  . Marital status: Divorced    Spouse name: Not on file  . Number of children: Not on file  . Years of education: Not on file  . Highest education level: Not on file  Occupational History  . Not on file  Tobacco Use  . Smoking status: Never Smoker  . Smokeless tobacco: Never Used  Vaping Use  . Vaping Use: Never used  Substance and Sexual Activity  . Alcohol use: Yes    Comment: occ  . Drug use: No  . Sexual activity: Not on file  Other Topics Concern  . 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    Son and daughter- both live locally- has a 22 yr old granddaughter   Divorced- 1998, Engaged (viance lives in [place])   Social Determinants of Radio broadcast assistant Strain:   . Difficulty of Paying Living Expenses:   Food Insecurity:   . Worried About Charity fundraiser in the Last Year:   . Arboriculturist in the Last Year:   Transportation Needs:   . Film/video editor (Medical):   Marland Kitchen Lack of Transportation (Non-Medical):   Physical Activity:   . Days of Exercise per Week:   . Minutes of Exercise per Session:   Stress:   . Feeling of Stress :   Social Connections:   . Frequency of Communication with Friends and Family:   . Frequency  of Social Gatherings with Friends and Family:   . Attends Religious Services:   . Active Member of Clubs or Organizations:   . Attends Archivist Meetings:   Marland Kitchen Marital Status:   Intimate Partner Violence:   . Fear of Current or Ex-Partner:   . Emotionally Abused:   Marland Kitchen Physically Abused:   . Sexually Abused:     Past Surgical History:  Procedure Laterality Date  . COLONOSCOPY    . CYSTOSCOPY     ?2000  . FRACTURE SURGERY  2000   finger-pins placed. "hard to wake up from anesthesia" per pt  . POLYPECTOMY    . TONSILLECTOMY      Family History  Problem Relation Age of Onset  . Cancer Mother   . Cancer Father   . Colon cancer Father 26    Allergies  Allergen Reactions  . Codeine Phosphate Nausea And Vomiting    Current Outpatient Medications on File Prior to Visit  Medication Sig Dispense Refill  . aspirin 81 MG tablet Take 81 mg by mouth daily.      . Calcium Carbonate-Vitamin D 600-400 MG-UNIT tablet Take 1 tablet by mouth 2 (two) times daily.    Marland Kitchen  pravastatin (PRAVACHOL) 40 MG tablet Take 1 tablet (40 mg total) by mouth daily. 90 tablet 0   Current Facility-Administered Medications on File Prior to Visit  Medication Dose Route Frequency Provider Last Rate Last Admin  . 0.9 %  sodium chloride infusion  500 mL Intravenous Once Danis, Estill Cotta III, MD        BP 126/70 (BP Location: Left Arm, Patient Position: Sitting, Cuff Size: Normal)   Pulse 72   Resp 16   Ht 5\' 5"  (1.651 m)   Wt 149 lb (67.6 kg)   SpO2 98%   BMI 24.79 kg/m       Objective:   Physical Exam Constitutional:      Appearance: Normal appearance.  HENT:     Head: Normocephalic and atraumatic.  Neurological:     Mental Status: She is alert and oriented to person, place, and time.  Psychiatric:        Mood and Affect: Mood normal.        Behavior: Behavior normal.           Assessment & Plan:  Memory Problem -patient scored 30 out of 30 on Mini-Mental status exam.  I really  think that her concerns about memory are more stress related to her job.  She is interested in the possibility of retiring.  I advised her that I think that she would find this helpful for her overall stress reduction. MMSE - Mini Mental State Exam 10/15/2019  Orientation to time 5  Orientation to Place 5  Registration 3  Attention/ Calculation 5  Recall 3  Language- name 2 objects 2  Language- repeat 1  Language- follow 3 step command 3  Language- read & follow direction 1  Write a sentence 1  Copy design 1  Total score 30   Shingrix #2 today.  30 minutes spent on today's visit.  The majority of this time was spent counseling the patient on stress management. This visit occurred during the SARS-CoV-2 public health emergency.  Safety protocols were in place, including screening questions prior to the visit, additional usage of staff PPE, and extensive cleaning of exam room while observing appropriate contact time as indicated for disinfecting solutions.

## 2019-10-18 DIAGNOSIS — M47812 Spondylosis without myelopathy or radiculopathy, cervical region: Secondary | ICD-10-CM | POA: Diagnosis not present

## 2019-10-18 DIAGNOSIS — M9903 Segmental and somatic dysfunction of lumbar region: Secondary | ICD-10-CM | POA: Diagnosis not present

## 2019-10-18 DIAGNOSIS — M9902 Segmental and somatic dysfunction of thoracic region: Secondary | ICD-10-CM | POA: Diagnosis not present

## 2019-10-18 DIAGNOSIS — M9901 Segmental and somatic dysfunction of cervical region: Secondary | ICD-10-CM | POA: Diagnosis not present

## 2019-10-24 DIAGNOSIS — M9903 Segmental and somatic dysfunction of lumbar region: Secondary | ICD-10-CM | POA: Diagnosis not present

## 2019-10-24 DIAGNOSIS — M9901 Segmental and somatic dysfunction of cervical region: Secondary | ICD-10-CM | POA: Diagnosis not present

## 2019-10-24 DIAGNOSIS — M47812 Spondylosis without myelopathy or radiculopathy, cervical region: Secondary | ICD-10-CM | POA: Diagnosis not present

## 2019-10-24 DIAGNOSIS — M9902 Segmental and somatic dysfunction of thoracic region: Secondary | ICD-10-CM | POA: Diagnosis not present

## 2019-10-26 DIAGNOSIS — M9901 Segmental and somatic dysfunction of cervical region: Secondary | ICD-10-CM | POA: Diagnosis not present

## 2019-10-26 DIAGNOSIS — M9902 Segmental and somatic dysfunction of thoracic region: Secondary | ICD-10-CM | POA: Diagnosis not present

## 2019-10-26 DIAGNOSIS — M47812 Spondylosis without myelopathy or radiculopathy, cervical region: Secondary | ICD-10-CM | POA: Diagnosis not present

## 2019-10-26 DIAGNOSIS — M9903 Segmental and somatic dysfunction of lumbar region: Secondary | ICD-10-CM | POA: Diagnosis not present

## 2019-10-29 DIAGNOSIS — M9902 Segmental and somatic dysfunction of thoracic region: Secondary | ICD-10-CM | POA: Diagnosis not present

## 2019-10-29 DIAGNOSIS — M47812 Spondylosis without myelopathy or radiculopathy, cervical region: Secondary | ICD-10-CM | POA: Diagnosis not present

## 2019-10-29 DIAGNOSIS — M9903 Segmental and somatic dysfunction of lumbar region: Secondary | ICD-10-CM | POA: Diagnosis not present

## 2019-10-29 DIAGNOSIS — M9901 Segmental and somatic dysfunction of cervical region: Secondary | ICD-10-CM | POA: Diagnosis not present

## 2019-10-31 DIAGNOSIS — M9903 Segmental and somatic dysfunction of lumbar region: Secondary | ICD-10-CM | POA: Diagnosis not present

## 2019-10-31 DIAGNOSIS — M47812 Spondylosis without myelopathy or radiculopathy, cervical region: Secondary | ICD-10-CM | POA: Diagnosis not present

## 2019-10-31 DIAGNOSIS — M9902 Segmental and somatic dysfunction of thoracic region: Secondary | ICD-10-CM | POA: Diagnosis not present

## 2019-10-31 DIAGNOSIS — M9901 Segmental and somatic dysfunction of cervical region: Secondary | ICD-10-CM | POA: Diagnosis not present

## 2019-11-21 ENCOUNTER — Other Ambulatory Visit: Payer: Self-pay | Admitting: Family

## 2019-11-21 DIAGNOSIS — Z1231 Encounter for screening mammogram for malignant neoplasm of breast: Secondary | ICD-10-CM

## 2019-12-04 ENCOUNTER — Other Ambulatory Visit: Payer: Self-pay

## 2019-12-04 ENCOUNTER — Ambulatory Visit
Admission: RE | Admit: 2019-12-04 | Discharge: 2019-12-04 | Disposition: A | Discharge status: Home / Self Care | Payer: BC Managed Care – PPO | Source: Ambulatory Visit | Attending: Family | Admitting: Family

## 2019-12-04 DIAGNOSIS — Z1231 Encounter for screening mammogram for malignant neoplasm of breast: Secondary | ICD-10-CM | POA: Diagnosis not present

## 2019-12-27 ENCOUNTER — Telehealth: Payer: Self-pay | Admitting: Family

## 2019-12-27 NOTE — Telephone Encounter (Signed)
Patient has signed up for a new [employer] plan. Per Lorella Nimrod, provider [name] is contracted as a specialist. Per [employer], Lenna Sciara would have to up date her contract with [employer] in order for patient to list her on her card.   [employer] Back # [phone]

## 2020-02-04 DIAGNOSIS — L218 Other seborrheic dermatitis: Secondary | ICD-10-CM | POA: Diagnosis not present

## 2020-02-04 DIAGNOSIS — L309 Dermatitis, unspecified: Secondary | ICD-10-CM | POA: Diagnosis not present

## 2020-02-04 DIAGNOSIS — L821 Other seborrheic keratosis: Secondary | ICD-10-CM | POA: Diagnosis not present

## 2020-02-04 DIAGNOSIS — L308 Other specified dermatitis: Secondary | ICD-10-CM | POA: Diagnosis not present

## 2020-02-06 ENCOUNTER — Encounter (HOSPITAL_BASED_OUTPATIENT_CLINIC_OR_DEPARTMENT_OTHER): Payer: Self-pay

## 2020-02-06 ENCOUNTER — Emergency Department (HOSPITAL_BASED_OUTPATIENT_CLINIC_OR_DEPARTMENT_OTHER)
Admission: EM | Admit: 2020-02-06 | Discharge: 2020-02-06 | Disposition: A | Discharge status: Home / Self Care | Payer: Medicare Other | Attending: Emergency Medicine | Admitting: Emergency Medicine

## 2020-02-06 ENCOUNTER — Emergency Department (HOSPITAL_BASED_OUTPATIENT_CLINIC_OR_DEPARTMENT_OTHER): Payer: Medicare Other

## 2020-02-06 ENCOUNTER — Other Ambulatory Visit: Payer: Self-pay

## 2020-02-06 DIAGNOSIS — R9431 Abnormal electrocardiogram [ECG] [EKG]: Secondary | ICD-10-CM | POA: Diagnosis not present

## 2020-02-06 DIAGNOSIS — S4992XA Unspecified injury of left shoulder and upper arm, initial encounter: Secondary | ICD-10-CM | POA: Diagnosis present

## 2020-02-06 DIAGNOSIS — S42292A Other displaced fracture of upper end of left humerus, initial encounter for closed fracture: Secondary | ICD-10-CM | POA: Insufficient documentation

## 2020-02-06 DIAGNOSIS — W01198A Fall on same level from slipping, tripping and stumbling with subsequent striking against other object, initial encounter: Secondary | ICD-10-CM | POA: Insufficient documentation

## 2020-02-06 MED ORDER — HYDROCODONE-ACETAMINOPHEN 5-325 MG PO TABS
1.0000 | ORAL_TABLET | Freq: Once | ORAL | Status: AC
  Administered 2020-02-06: 1 via ORAL
  Filled 2020-02-06: qty 1

## 2020-02-06 MED ORDER — ONDANSETRON 4 MG PO TBDP
4.0000 mg | ORAL_TABLET | Freq: Once | ORAL | Status: AC
  Administered 2020-02-06: 4 mg via ORAL
  Filled 2020-02-06: qty 1

## 2020-02-06 MED ORDER — HYDROCODONE-ACETAMINOPHEN 5-325 MG PO TABS
1.0000 | ORAL_TABLET | ORAL | 0 refills | Status: DC | PRN
Start: 2020-02-06 — End: 2020-10-07

## 2020-02-06 MED ORDER — ONDANSETRON HCL 4 MG PO TABS: 4.0000 mg | ORAL_TABLET | Freq: Three times a day (TID) | ORAL | 0 refills | Status: DC | PRN

## 2020-02-06 NOTE — ED Triage Notes (Signed)
Pt states she fell ~650pm-pain to left UE-states pain is from left index finger to shoulder-NAD-to triage in w/c

## 2020-02-06 NOTE — Discharge Instructions (Addendum)
You were evaluated in the Emergency Department and after careful evaluation, we did not find any emergent condition requiring admission or further testing in the hospital.  Your symptoms seem to be due to a broken bone in your arm. Please use the sling immobilizer for most of the day. It can be taken off for bathing purposes. We recommend follow-up with orthopedic specialist to ensure proper healing. You can use Tylenol or Motrin at home for discomfort. We are also providing you with Norco tablets for more significant pain.  Please return to the Emergency Department if you experience any worsening of your condition.  Thank you for allowing [place] to be a part of your care.

## 2020-02-06 NOTE — ED Provider Notes (Addendum)
[facility] Emergency Department Provider Note MRN:  [id number]  Arrival date & time: 02/06/20     Chief Complaint   Fall   History of Present Illness   Megan Lutz is a 69 y.o. year-old female with a history of osteopenia presenting to the ED with chief complaint of fall.  Tripped and fell forward onto the ground prior to arrival, denies any dizziness or chest pain or any other symptoms preceding the fall. Fell onto her left shoulder. Denies head trauma or loss consciousness, no neck or back pain, no injuries to the chest or abdomen or legs. Pain in the left shoulder is constant, moderate to severe, worse with motion or palpation.  Review of Systems  A complete 10 system review of systems was obtained and all systems are negative except as noted in the HPI and PMH.   Patient's Health History    Past Medical History:  Diagnosis Date  . Allergy   . ANEMIA 09/25/2009  . Diverticular disease 2013  . Hemorrhoid   . HYPERLIPIDEMIA 12/07/2007  . Kidney stone   . OSTEOPENIA 12/07/2007  . PERIMENOPAUSAL SYNDROME 12/10/2008    Past Surgical History:  Procedure Laterality Date  . COLONOSCOPY    . CYSTOSCOPY     ?2000  . FRACTURE SURGERY  2000   finger-pins placed. "hard to wake up from anesthesia" per pt  . POLYPECTOMY    . TONSILLECTOMY      Family History  Problem Relation Age of Onset  . Cancer Mother   . Cancer Father   . Colon cancer Father 103    Social History   Socioeconomic History  . Marital status: Divorced    Spouse name: Not on file  . Number of children: Not on file  . Years of education: Not on file  . Highest education level: Not on file  Occupational History  . Not on file  Tobacco Use  . Smoking status: Never Smoker  . Smokeless tobacco: Never Used  Vaping Use  . Vaping Use: Never used  Substance and Sexual Activity  . Alcohol use: Yes    Comment: occ  . Drug use: No  . Sexual activity: Not on file  Other Topics  Concern  . 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    Son and daughter- both live locally- has a 18 yr old granddaughter   Divorced- 1998, Engaged (viance lives in [place])   Social Determinants of Health   Financial Resource Strain:   . Difficulty of Paying Living Expenses: Not on file  Food Insecurity:   . Worried About Charity fundraiser in the Last Year: Not on file  . Ran Out of Food in the Last Year: Not on file  Transportation Needs:   . Lack of Transportation (Medical): Not on file  . Lack of Transportation (Non-Medical): Not on file  Physical Activity:   . Days of Exercise per Week: Not on file  . Minutes of Exercise per Session: Not on file  Stress:   . Feeling of Stress : Not on file  Social Connections:   . Frequency of Communication with Friends and Family: Not on file  . Frequency of Social Gatherings with Friends and Family: Not on file  . Attends Religious Services: Not on file  . Active Member of Clubs or Organizations: Not on file  . Attends Archivist Meetings: Not on file  .  Marital Status: Not on file  Intimate Partner Violence:   . Fear of Current or Ex-Partner: Not on file  . Emotionally Abused: Not on file  . Physically Abused: Not on file  . Sexually Abused: Not on file     Physical Exam   Vitals:   02/06/20 1939  BP: 104/69  Pulse: 68  Resp: 18  Temp: 98.6 F (37 C)  SpO2: 98%    CONSTITUTIONAL: Well-appearing, NAD NEURO:  Alert and oriented x 3, no focal deficits EYES:  eyes equal and reactive ENT/NECK:  no LAD, no JVD CARDIO: Regular rate, well-perfused, normal S1 and S2 PULM:  CTAB no wheezing or rhonchi GI/GU:  normal bowel sounds, non-distended, non-tender MSK/SPINE:  No gross deformities, no edema SKIN:  no rash, atraumatic PSYCH:  Appropriate speech and behavior  *Additional and/or pertinent findings included in MDM below  Diagnostic and Interventional Summary      EKG Interpretation  Date/Time:    Ventricular Rate:    PR Interval:    QRS Duration:   QT Interval:    QTC Calculation:   R Axis:     Text Interpretation:        Labs Reviewed - No data to display  DG Shoulder Left  Final Result      Medications  HYDROcodone-acetaminophen (NORCO/VICODIN) 5-325 MG per tablet 1 tablet (1 tablet Oral Given 02/06/20 2046)     Procedures  /  Critical Care Procedures  ED Course and Medical Decision Making  I have reviewed the triage vital signs, the nursing notes, and pertinent available records from the EMR.  Listed above are laboratory and imaging tests that I personally ordered, reviewed, and interpreted and then considered in my medical decision making (see below for details).  Mechanical fall with isolated shoulder pain, no spinal tenderness, no abdominal tenderness, bilateral breath sounds, no increased work of breathing, no signs of head trauma. X-ray reveals humeral head fracture, limb is neurovascularly intact, appropriate for discharge with Ortho follow-up.    Addendum 11 PM: Patient did have some nausea and lightheadedness after Norco tablet, some noted soft blood pressures as well. She was observed on cardiac monitoring for 1-2 more hours afterwards, EKG reassuring, slowly felt better with Zofran. Highly suspect this is related to medication side effect, which she has had before with narcotics. She is now feeling well and appropriate for discharge.   Barth Kirks. Sedonia Small, [place] [email or web address]  Final Clinical Impressions(s) / ED Diagnoses     ICD-10-CM   1. Closed fracture of head of left humerus, initial encounter  S42.292A     ED Discharge Orders         Ordered    HYDROcodone-acetaminophen (NORCO/VICODIN) 5-325 MG tablet  Every 4 hours PRN        02/06/20 2124           Discharge Instructions Discussed with and Provided to Patient:     Discharge Instructions      You were evaluated in the Emergency Department and after careful evaluation, we did not find any emergent condition requiring admission or further testing in the hospital.  Your symptoms seem to be due to a broken bone in your arm. Please use the sling immobilizer for most of the day. It can be taken off for bathing purposes. We recommend follow-up with orthopedic specialist to ensure proper healing. You can use Tylenol or Motrin at home for discomfort. We are also providing you  with Norco tablets for more significant pain.  Please return to the Emergency Department if you experience any worsening of your condition.  Thank you for allowing [place] to be a part of your care.       Maudie Flakes, MD 02/06/20 2127    Maudie Flakes, MD 02/06/20 [phone]

## 2020-02-06 NOTE — ED Notes (Signed)
Into d/c patient. States a while ago she felt nauseated and dizzy like she was going to pass out, somewhat better now. While in talking through d/c instructions, pt says that she is feeling dizzy again and nauseated. Noted to be clammy to touch. Pressure 84SBP. Notified EDP. Will obtain EKG

## 2020-02-11 DIAGNOSIS — M79602 Pain in left arm: Secondary | ICD-10-CM | POA: Diagnosis not present

## 2020-02-11 DIAGNOSIS — S42252A Displaced fracture of greater tuberosity of left humerus, initial encounter for closed fracture: Secondary | ICD-10-CM | POA: Diagnosis not present

## 2020-03-03 DIAGNOSIS — S42252A Displaced fracture of greater tuberosity of left humerus, initial encounter for closed fracture: Secondary | ICD-10-CM | POA: Diagnosis not present

## 2020-03-20 DIAGNOSIS — S42252A Displaced fracture of greater tuberosity of left humerus, initial encounter for closed fracture: Secondary | ICD-10-CM | POA: Diagnosis not present

## 2020-03-20 DIAGNOSIS — M25512 Pain in left shoulder: Secondary | ICD-10-CM | POA: Diagnosis not present

## 2020-03-24 DIAGNOSIS — M25512 Pain in left shoulder: Secondary | ICD-10-CM | POA: Diagnosis not present

## 2020-03-27 DIAGNOSIS — M25512 Pain in left shoulder: Secondary | ICD-10-CM | POA: Diagnosis not present

## 2020-04-01 DIAGNOSIS — M25612 Stiffness of left shoulder, not elsewhere classified: Secondary | ICD-10-CM | POA: Diagnosis not present

## 2020-04-02 ENCOUNTER — Other Ambulatory Visit: Payer: Self-pay | Admitting: Family

## 2020-04-02 DIAGNOSIS — E785 Hyperlipidemia, unspecified: Secondary | ICD-10-CM

## 2020-04-03 DIAGNOSIS — H524 Presbyopia: Secondary | ICD-10-CM | POA: Diagnosis not present

## 2020-04-04 DIAGNOSIS — M25512 Pain in left shoulder: Secondary | ICD-10-CM | POA: Diagnosis not present

## 2020-04-08 DIAGNOSIS — M79602 Pain in left arm: Secondary | ICD-10-CM | POA: Diagnosis not present

## 2020-04-10 DIAGNOSIS — M25512 Pain in left shoulder: Secondary | ICD-10-CM | POA: Diagnosis not present

## 2020-04-15 DIAGNOSIS — M79602 Pain in left arm: Secondary | ICD-10-CM | POA: Diagnosis not present

## 2020-04-24 DIAGNOSIS — M25512 Pain in left shoulder: Secondary | ICD-10-CM | POA: Diagnosis not present

## 2020-04-28 DIAGNOSIS — M25512 Pain in left shoulder: Secondary | ICD-10-CM | POA: Diagnosis not present

## 2020-05-01 DIAGNOSIS — M7502 Adhesive capsulitis of left shoulder: Secondary | ICD-10-CM | POA: Diagnosis not present

## 2020-05-01 DIAGNOSIS — M79602 Pain in left arm: Secondary | ICD-10-CM | POA: Diagnosis not present

## 2020-05-01 DIAGNOSIS — S42252D Displaced fracture of greater tuberosity of left humerus, subsequent encounter for fracture with routine healing: Secondary | ICD-10-CM | POA: Diagnosis not present

## 2020-05-02 DIAGNOSIS — M25512 Pain in left shoulder: Secondary | ICD-10-CM | POA: Diagnosis not present

## 2020-05-08 DIAGNOSIS — M25512 Pain in left shoulder: Secondary | ICD-10-CM | POA: Diagnosis not present

## 2020-05-13 DIAGNOSIS — M25512 Pain in left shoulder: Secondary | ICD-10-CM | POA: Diagnosis not present

## 2020-05-15 DIAGNOSIS — M25512 Pain in left shoulder: Secondary | ICD-10-CM | POA: Diagnosis not present

## 2020-05-22 DIAGNOSIS — M25512 Pain in left shoulder: Secondary | ICD-10-CM | POA: Diagnosis not present

## 2020-05-27 DIAGNOSIS — M25512 Pain in left shoulder: Secondary | ICD-10-CM | POA: Diagnosis not present

## 2020-05-30 DIAGNOSIS — M25512 Pain in left shoulder: Secondary | ICD-10-CM | POA: Diagnosis not present

## 2020-06-03 DIAGNOSIS — M79602 Pain in left arm: Secondary | ICD-10-CM | POA: Diagnosis not present

## 2020-06-06 DIAGNOSIS — M79602 Pain in left arm: Secondary | ICD-10-CM | POA: Diagnosis not present

## 2020-06-06 DIAGNOSIS — M25512 Pain in left shoulder: Secondary | ICD-10-CM | POA: Diagnosis not present

## 2020-06-09 DIAGNOSIS — M79602 Pain in left arm: Secondary | ICD-10-CM | POA: Diagnosis not present

## 2020-06-09 DIAGNOSIS — M25512 Pain in left shoulder: Secondary | ICD-10-CM | POA: Diagnosis not present

## 2020-06-12 DIAGNOSIS — S42252D Displaced fracture of greater tuberosity of left humerus, subsequent encounter for fracture with routine healing: Secondary | ICD-10-CM | POA: Diagnosis not present

## 2020-06-17 DIAGNOSIS — M25512 Pain in left shoulder: Secondary | ICD-10-CM | POA: Diagnosis not present

## 2020-06-20 DIAGNOSIS — M25512 Pain in left shoulder: Secondary | ICD-10-CM | POA: Diagnosis not present

## 2020-06-24 DIAGNOSIS — M25512 Pain in left shoulder: Secondary | ICD-10-CM | POA: Diagnosis not present

## 2020-06-27 DIAGNOSIS — M25512 Pain in left shoulder: Secondary | ICD-10-CM | POA: Diagnosis not present

## 2020-06-30 DIAGNOSIS — M25512 Pain in left shoulder: Secondary | ICD-10-CM | POA: Diagnosis not present

## 2020-07-03 DIAGNOSIS — M25512 Pain in left shoulder: Secondary | ICD-10-CM | POA: Diagnosis not present

## 2020-07-06 ENCOUNTER — Other Ambulatory Visit: Payer: Self-pay | Admitting: Family

## 2020-07-06 DIAGNOSIS — E785 Hyperlipidemia, unspecified: Secondary | ICD-10-CM

## 2020-07-10 DIAGNOSIS — M25512 Pain in left shoulder: Secondary | ICD-10-CM | POA: Diagnosis not present

## 2020-07-17 DIAGNOSIS — M25512 Pain in left shoulder: Secondary | ICD-10-CM | POA: Diagnosis not present

## 2020-07-22 DIAGNOSIS — M25512 Pain in left shoulder: Secondary | ICD-10-CM | POA: Diagnosis not present

## 2020-07-22 DIAGNOSIS — M79602 Pain in left arm: Secondary | ICD-10-CM | POA: Diagnosis not present

## 2020-07-24 DIAGNOSIS — M79602 Pain in left arm: Secondary | ICD-10-CM | POA: Diagnosis not present

## 2020-07-24 DIAGNOSIS — S42252D Displaced fracture of greater tuberosity of left humerus, subsequent encounter for fracture with routine healing: Secondary | ICD-10-CM | POA: Diagnosis not present

## 2020-08-01 DIAGNOSIS — M25512 Pain in left shoulder: Secondary | ICD-10-CM | POA: Diagnosis not present

## 2020-08-08 DIAGNOSIS — M25512 Pain in left shoulder: Secondary | ICD-10-CM | POA: Diagnosis not present

## 2020-08-14 DIAGNOSIS — M25512 Pain in left shoulder: Secondary | ICD-10-CM | POA: Diagnosis not present

## 2020-10-07 ENCOUNTER — Other Ambulatory Visit: Payer: Self-pay

## 2020-10-07 ENCOUNTER — Ambulatory Visit (INDEPENDENT_AMBULATORY_CARE_PROVIDER_SITE_OTHER): Payer: Medicare Other | Admitting: Family

## 2020-10-07 VITALS — BP 124/70 | HR 92 | Temp 98.7°F | Resp 16 | Wt 147.0 lb

## 2020-10-07 DIAGNOSIS — R42 Dizziness and giddiness: Secondary | ICD-10-CM | POA: Insufficient documentation

## 2020-10-07 DIAGNOSIS — R059 Cough, unspecified: Secondary | ICD-10-CM | POA: Diagnosis not present

## 2020-10-07 MED ORDER — OMEPRAZOLE 40 MG PO CPDR: 40.0000 mg | DELAYED_RELEASE_CAPSULE | Freq: Every day | ORAL | 3 refills | Status: DC

## 2020-10-07 NOTE — Patient Instructions (Signed)
Please start omeprazole for cough.

## 2020-10-07 NOTE — Assessment & Plan Note (Signed)
New. Will add trial of omeprazole 40mg  once daily. Re-assess in 1 month at [facility].

## 2020-10-07 NOTE — Assessment & Plan Note (Signed)
I suspect mild orthostasis due to poor water intake. She admits to not drinking enough throughout the day.  Encouraged pt to increase fluid intake and call if new/worsening symptoms.

## 2020-10-07 NOTE — Progress Notes (Signed)
Acute Office Visit  Subjective:    Patient ID: Megan Lutz, female    DOB: [birth year], 70 y.o.   MRN: [id number]  Chief Complaint  Patient presents with   Dizziness    Complains of occasional light headed    HPI Patient is in today with chief complaint of dizziness. She reports some heat intolerance.  Occasionally she feels "a little off balance." Denies spinning sensation.  Reports that it only occurs once in a while.  She reports that she had one mechanical fall, tripped on the sidewalk in her flip flops. This occurred on 10/21.  She saw Dr. Amedeo Plenty (orthopedics) and they noted fracture of left proximal humerus.  She has been doing physical therapy.    C/o cough off/on x 1 month.    Past Medical History:  Diagnosis Date   Allergy    ANEMIA 09/25/2009   Diverticular disease 2013   Hemorrhoid    HYPERLIPIDEMIA 12/07/2007   Kidney stone    OSTEOPENIA 12/07/2007   PERIMENOPAUSAL SYNDROME 12/10/2008    Past Surgical History:  Procedure Laterality Date   COLONOSCOPY     CYSTOSCOPY     ?2000   FRACTURE SURGERY  2000   finger-pins placed. "hard to wake up from anesthesia" per pt   POLYPECTOMY     TONSILLECTOMY      Family History  Problem Relation Age of Onset   Cancer Mother    Cancer Father    Colon cancer Father 2    Social History   Socioeconomic History   Marital status: Divorced    Spouse name: Not on file   Number of children: Not on file   Years of education: Not on file   Highest education level: Not on file  Occupational History   Not on file  Tobacco Use   Smoking status: Never   Smokeless tobacco: Never  Vaping Use   Vaping Use: Never used  Substance and Sexual Activity   Alcohol use: Yes    Comment: occ   Drug use: No   Sexual activity: Not on file  Other Topics Concern   Not on file  Social History Narrative   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    Son and daughter- both live locally- has a 38 yr old  granddaughter   Divorced- 1998, Engaged (viance lives in [place])   Social Determinants of Radio broadcast assistant Strain: Not on file  Food Insecurity: Not on file  Transportation Needs: Not on file  Physical Activity: Not on file  Stress: Not on file  Social Connections: Not on file  Intimate Partner Violence: Not on file    Outpatient Medications Prior to Visit  Medication Sig Dispense Refill   aspirin 81 MG tablet Take 81 mg by mouth daily.       Calcium Carbonate-Vitamin D 600-400 MG-UNIT tablet Take 1 tablet by mouth 2 (two) times daily.     pravastatin (PRAVACHOL) 40 MG tablet Take 1 tablet by mouth once daily 90 tablet 0   HYDROcodone-acetaminophen (NORCO/VICODIN) 5-325 MG tablet Take 1 tablet by mouth every 4 (four) hours as needed. 8 tablet 0   ondansetron (ZOFRAN) 4 MG tablet Take 1 tablet (4 mg total) by mouth every 8 (eight) hours as needed for nausea or vomiting. 20 tablet 0   Facility-Administered Medications Prior to Visit  Medication Dose Route Frequency Provider Last Rate Last Admin   0.9 %  sodium chloride infusion  500 mL  Intravenous Once Doran Stabler, MD        Allergies  Allergen Reactions   Codeine Phosphate Nausea And Vomiting    Review of Systems     Objective:    Physical Exam Constitutional:      Appearance: She is well-developed.  HENT:     Right Ear: Tympanic membrane and ear canal normal.     Left Ear: Tympanic membrane and ear canal normal.  Cardiovascular:     Rate and Rhythm: Normal rate and regular rhythm.     Pulses:          Carotid pulses are 2+ on the right side and 2+ on the left side.    Heart sounds: Normal heart sounds. No murmur heard.    Comments: No carotid bruits Pulmonary:     Effort: Pulmonary effort is normal. No respiratory distress.     Breath sounds: Normal breath sounds. No wheezing.  Neurological:     General: No focal deficit present.     Mental Status: She is oriented to person, place, and time.      Cranial Nerves: Cranial nerves are intact.     Motor: Motor function is intact.     Coordination: Coordination is intact.     Comments: Bilateral UE/LE strength is 5/5 Negative Dix-Hallpike bilaterally  Psychiatric:        Behavior: Behavior normal.        Thought Content: Thought content normal.        Judgment: Judgment normal.    BP 124/70 (BP Location: Right Arm, Patient Position: Sitting, Cuff Size: Small)   Pulse 92   Temp 98.7 F (37.1 C) (Oral)   Resp 16   Wt 147 lb (66.7 kg)   SpO2 95%   BMI 24.46 kg/m  Wt Readings from Last 3 Encounters:  10/07/20 147 lb (66.7 kg)  02/06/20 140 lb (63.5 kg)  10/15/19 149 lb (67.6 kg)    Health Maintenance Due  Topic Date Due   Hepatitis C Screening  Never done   COVID-19 Vaccine (3 - Booster for Pfizer series) 01/01/2020    There are no preventive care reminders to display for this patient.   Lab Results  Component Value Date   TSH 3.32 06/11/2019   Lab Results  Component Value Date   WBC 7.1 06/11/2019   HGB 14.4 06/11/2019   HCT 43.1 06/11/2019   MCV 91.7 06/11/2019   PLT 293.0 06/11/2019   Lab Results  Component Value Date   NA 140 06/06/2019   K 4.4 06/06/2019   CO2 28 06/06/2019   GLUCOSE 106 (H) 06/06/2019   BUN 17 06/06/2019   CREATININE 1.01 06/06/2019   BILITOT 0.4 06/11/2019   ALKPHOS 81 06/11/2019   AST 23 06/11/2019   ALT 23 06/11/2019   PROT 7.3 06/11/2019   ALBUMIN 4.6 06/11/2019   CALCIUM 9.4 06/06/2019   GFR 54.59 (L) 06/06/2019   Lab Results  Component Value Date   CHOL 200 06/06/2019   Lab Results  Component Value Date   HDL 57.90 06/06/2019   Lab Results  Component Value Date   LDLCALC 113 (H) 06/06/2019   Lab Results  Component Value Date   TRIG 147.0 06/06/2019   Lab Results  Component Value Date   CHOLHDL 3 06/06/2019   No results found for: HGBA1C     Assessment & Plan:   Problem List Items Addressed This Visit       Unprioritized   Dizziness -  Primary    I  suspect mild orthostasis due to poor water intake. She admits to not drinking enough throughout the day.  Encouraged pt to increase fluid intake and call if new/worsening symptoms.        Cough    New. Will add trial of omeprazole 40mg  once daily. Re-assess in 1 month at [facility].        30 minutes spent on today's visit.  Meds ordered this encounter  Medications   omeprazole (PRILOSEC) 40 MG capsule    Sig: Take 1 capsule (40 mg total) by mouth daily.    Dispense:  30 capsule    Refill:  3    Order Specific Question:   Supervising Provider    Answer:   Penni Homans A [[id number]]     Nance Pear, NP

## 2020-10-29 ENCOUNTER — Ambulatory Visit (INDEPENDENT_AMBULATORY_CARE_PROVIDER_SITE_OTHER): Payer: Medicare Other | Admitting: Family

## 2020-10-29 ENCOUNTER — Other Ambulatory Visit: Payer: Self-pay

## 2020-10-29 ENCOUNTER — Other Ambulatory Visit (HOSPITAL_COMMUNITY)
Admission: RE | Admit: 2020-10-29 | Discharge: 2020-10-29 | Disposition: A | Discharge status: Home / Self Care | Payer: Medicare Other | Source: Ambulatory Visit | Attending: Family | Admitting: Family

## 2020-10-29 ENCOUNTER — Encounter: Payer: Self-pay | Admitting: Family

## 2020-10-29 VITALS — BP 108/84 | HR 69 | Temp 98.9°F | Resp 18 | Ht 65.0 in | Wt 148.6 lb

## 2020-10-29 DIAGNOSIS — E785 Hyperlipidemia, unspecified: Secondary | ICD-10-CM | POA: Diagnosis not present

## 2020-10-29 DIAGNOSIS — Z0001 Encounter for general adult medical examination with abnormal findings: Secondary | ICD-10-CM

## 2020-10-29 DIAGNOSIS — Z0000 Encounter for general adult medical examination without abnormal findings: Secondary | ICD-10-CM | POA: Insufficient documentation

## 2020-10-29 DIAGNOSIS — Z01419 Encounter for gynecological examination (general) (routine) without abnormal findings: Secondary | ICD-10-CM | POA: Diagnosis not present

## 2020-10-29 DIAGNOSIS — R059 Cough, unspecified: Secondary | ICD-10-CM | POA: Diagnosis not present

## 2020-10-29 LAB — COMPREHENSIVE METABOLIC PANEL
ALT: 23 U/L (ref 0–35)
AST: 26 U/L (ref 0–37)
Albumin: 4.3 g/dL (ref 3.5–5.2)
Alkaline Phosphatase: 66 U/L (ref 39–117)
BUN: 17 mg/dL (ref 6–23)
CO2: 29 mEq/L (ref 19–32)
Calcium: 9.4 mg/dL (ref 8.4–10.5)
Chloride: 106 mEq/L (ref 96–112)
Creatinine, Ser: 0.89 mg/dL (ref 0.40–1.20)
GFR: 66.39 mL/min (ref >60.00)
Glucose, Bld: 91 mg/dL (ref 70–99)
Potassium: 4.2 mEq/L (ref 3.5–5.1)
Sodium: 142 mEq/L (ref 135–145)
Total Bilirubin: 0.4 mg/dL (ref 0.2–1.2)
Total Protein: 7 g/dL (ref 6.0–8.3)

## 2020-10-29 LAB — LIPID PANEL
Cholesterol: 219 mg/dL — ABNORMAL HIGH (ref 0–200)
HDL: 52.8 mg/dL (ref >39.00)
NonHDL: 166.24
Total CHOL/HDL Ratio: 4
Triglycerides: 202 mg/dL — ABNORMAL HIGH (ref 0.0–149.0)
VLDL: 40.4 mg/dL — ABNORMAL HIGH (ref 0.0–40.0)

## 2020-10-29 LAB — LDL CHOLESTEROL, DIRECT: Direct LDL: 132 mg/dL

## 2020-10-29 NOTE — Progress Notes (Signed)
Subjective:   By signing my name below, I, Megan Lutz, attest that this documentation has been prepared under the direction and in the presence of [name]. 10/29/2020    Patient ID: Megan Lutz, female    DOB: Oct 31, 1950, 70 y.o.   MRN: [id number]  Chief Complaint  Patient presents with   Annual Exam    Pt states fasting     HPI Patient is in today for a office visit. She denies having any unexpected weight change, hearing loss and rhinorrhea, visual disturbance, cough, chest pain and leg swelling, nausea, vomiting, diarrhea and blood in stool, or dysuria and frequency, for myalgias and arthralgias, rash, headaches, adenopathy, depression or anxiety at this time. She had no recent changes to her family history. She does not use drugs, she does not drink, she never smoked. She is not sexually active at this time. She is getting married in September, 2022.   Cough- she continues having a cough since taking 40 mg pravastatin daily PO. She is taking 40 mg Prilosec daily PO to manage her symptoms.   Immunizations: She is UTD on tetanus vaccines. She has 2 pfizer Covid-19 vaccines and is not willing to get anymore booster vaccines at this time. She is UTD on pneumonia vaccines. She is UTD on shingles vaccines. Diet: She is managing a healthy diet at this time. Exercise: She participates in exercise by walking. Colonoscopy: Last completed 03/25/2017. Results showed one 2 mm polyp at the splenic flexure removed, diverticulosis in the sigmoid colon in the transverse colon, otherwise results are normal. Repeat in 5 years. Dexa: Last completed 04/01/2017. Results showed osteopenic. Repeat in 2 years.  Pap Smear: Last completed 06/11/2019. Results normal. Repeat in 3 years. She is interested in getting a pap smear. Mammogram: Last completed 12/04/2019. Results normal. Repeat in 1 year. She gets her mammogram at the [facility] center. She is planning to set up and  appointment.   Health Maintenance Due  Topic Date Due   Hepatitis C Screening  Never done   COVID-19 Vaccine (3 - Booster for [employer] series) 01/01/2020    Past Medical History:  Diagnosis Date   Allergy    ANEMIA 09/25/2009   Diverticular disease 2013   Hemorrhoid    HYPERLIPIDEMIA 12/07/2007   Kidney stone    OSTEOPENIA 12/07/2007   PERIMENOPAUSAL SYNDROME 12/10/2008    Past Surgical History:  Procedure Laterality Date   COLONOSCOPY     CYSTOSCOPY     ?2000   FRACTURE SURGERY  2000   finger-pins placed. "hard to wake up from anesthesia" per pt   POLYPECTOMY     TONSILLECTOMY      Family History  Problem Relation Age of Onset   Cancer Mother    Cancer Father    Colon cancer Father 30    Social History   Socioeconomic History   Marital status: Divorced    Spouse name: Not on file   Number of children: Not on file   Years of education: Not on file   Highest education level: Not on file  Occupational History   Not on file  Tobacco Use   Smoking status: Never   Smokeless tobacco: Never  Vaping Use   Vaping Use: Never used  Substance and Sexual Activity   Alcohol use: Yes    Comment: occ   Drug use: No   Sexual activity: Not Currently  Other Topics Concern   Not on file  Social History Narrative  retired   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       Calcium Carbonate-Vitamin D 600-400 MG-UNIT tablet Take 1 tablet by mouth 2 (two) times daily.     omeprazole (PRILOSEC) 40 MG capsule Take 1  capsule (40 mg total) by mouth daily. 30 capsule 3   pravastatin (PRAVACHOL) 40 MG tablet Take 1 tablet by mouth once daily 90 tablet 0   Facility-Administered Medications Prior to Visit  Medication Dose Route Frequency Provider Last Rate Last Admin   0.9 %  sodium chloride infusion  500 mL Intravenous Once Danis, Estill Cotta III, MD        Allergies  Allergen Reactions   Codeine Phosphate Nausea And Vomiting    Review of Systems  Constitutional:        (-)unexpected weight change (-)Adenopathy  HENT:  Negative for hearing loss.        (-)Rhinorrhea   Eyes:        (-)Visual disturbance  Respiratory:  Negative for cough.   Cardiovascular:  Negative for chest pain and leg swelling.  Gastrointestinal:  Negative for blood in stool, constipation, diarrhea, nausea and vomiting.  Genitourinary:  Negative for dysuria and frequency.  Musculoskeletal:  Negative for joint pain and myalgias.  Skin:  Negative for rash.  Neurological:  Negative for headaches.  Psychiatric/Behavioral:  Negative for depression. The patient is not nervous/anxious.       Objective:    Physical Exam Exam conducted with a chaperone present.  Constitutional:      General: She is not in acute distress.    Appearance: Normal appearance. She is not ill-appearing.  HENT:     Head: Normocephalic and atraumatic.     Right Ear: Tympanic membrane, ear canal and external ear normal.     Left Ear: Tympanic membrane, ear canal and external ear normal.  Eyes:     Extraocular Movements: Extraocular movements intact.     Pupils: Pupils are equal, round, and reactive to light.     Comments: No nystagmus  Cardiovascular:     Rate and Rhythm: Normal rate and regular rhythm.     Pulses: Normal pulses.     Heart sounds: Normal heart sounds. No murmur heard.   No gallop.  Pulmonary:     Effort: Pulmonary effort is normal. No respiratory distress.     Breath sounds: Normal breath sounds. No wheezing, rhonchi or rales.   Abdominal:     General: Bowel sounds are normal. There is no distension.     Palpations: Abdomen is soft.     Tenderness: There is no abdominal tenderness. There is no guarding or rebound.  Genitourinary:    General: Normal vulva.     Exam position: Lithotomy position.     Pubic Area: No rash.      Labia:        Right: No rash.        Left: No rash.      Vagina:  Normal.     Cervix: Normal.     Uterus: Normal.      Adnexa: Right adnexa normal and left adnexa normal.     Rectum: Normal.  Musculoskeletal:     Comments: 5/5 strength in both upper and lower extremities  Lymphadenopathy:     Cervical: No cervical adenopathy.  Skin:    General: Skin is warm and dry.  Neurological:     Mental Status: She is alert and oriented to person, place, and time.     Deep Tendon Reflexes:     Reflex Scores:      Patellar reflexes are 2+ on the right side and 2+ on the left side. Psychiatric:        Behavior: Behavior normal.        Judgment: Judgment normal.    BP 108/84 (BP Location: Left Arm, Patient Position: Sitting, Cuff Size: Normal)   Pulse 69   Temp 98.9 F (37.2 C) (Oral)   Resp 18   Ht $R'5\' 5"'rJ$  (1.651 m)   Wt 148 lb 9.6 oz (67.4 kg)   SpO2 96%   BMI 24.73 kg/m  Wt Readings from Last 3 Encounters:  10/29/20 148 lb 9.6 oz (67.4 kg)  10/07/20 147 lb (66.7 kg)  02/06/20 140 lb (63.5 kg)       Assessment & Plan:   Problem List Items Addressed This Visit       Unprioritized   Preventative health care - Primary    Encouraged pt to continue healthy diet and regular exercise. Pap today at pt request.  Mammo will be due in august.  Colo up to date.        Relevant Orders   MM 3D SCREEN BREAST BILATERAL   Cytology - PAP( [facility])   Cough    Notes some improvement with addition of omeprazole $RemoveBefor'40mg'wIhhzgsVLvwY$  daily. Continue same.        Other Visit Diagnoses     Hyperlipidemia, unspecified hyperlipidemia type       Relevant Orders   Lipid panel   Comp Met (CMET)         No orders of the defined types were placed in this encounter.   Cordella Register NP. 10/29/2020, personally preformed the services described in this documentation.  All medical record entries made by the scribe were at my direction and in my presence.  I have reviewed the chart and discharge instructions (if applicable) and agree that the record reflects my personal performance and is accurate and complete. 10/29/2020   I,Megan Lutz,acting as a Education administrator for Nance Pear, NP.,have documented all relevant documentation on the behalf of Nance Pear, NP,as directed by  Nance Pear, NP while in the presence of Nance Pear, NP.   Nance Pear, NP

## 2020-10-29 NOTE — Assessment & Plan Note (Signed)
Notes some improvement with addition of omeprazole 40mg  daily. Continue same.

## 2020-10-29 NOTE — Assessment & Plan Note (Signed)
Encouraged pt to continue healthy diet and regular exercise. Pap today at pt request.  Mammo will be due in august.  Colo up to date.

## 2020-10-29 NOTE — Patient Instructions (Signed)
Please complete lab work prior to leaving.   

## 2020-10-30 ENCOUNTER — Encounter: Payer: Self-pay | Admitting: Family

## 2020-11-03 LAB — CYTOLOGY - PAP
Chlamydia: NEGATIVE
Comment: NEGATIVE
Comment: NORMAL
Diagnosis: NEGATIVE
Neisseria Gonorrhea: NEGATIVE

## 2020-12-03 ENCOUNTER — Other Ambulatory Visit: Payer: Self-pay | Admitting: Family

## 2020-12-03 DIAGNOSIS — E785 Hyperlipidemia, unspecified: Secondary | ICD-10-CM

## 2021-01-13 DIAGNOSIS — H43813 Vitreous degeneration, bilateral: Secondary | ICD-10-CM | POA: Diagnosis not present

## 2021-01-13 DIAGNOSIS — H40053 Ocular hypertension, bilateral: Secondary | ICD-10-CM | POA: Diagnosis not present

## 2021-01-21 ENCOUNTER — Ambulatory Visit (INDEPENDENT_AMBULATORY_CARE_PROVIDER_SITE_OTHER): Payer: Medicare Other | Admitting: Family

## 2021-01-21 ENCOUNTER — Other Ambulatory Visit: Payer: Self-pay

## 2021-01-21 ENCOUNTER — Ambulatory Visit (HOSPITAL_BASED_OUTPATIENT_CLINIC_OR_DEPARTMENT_OTHER)
Admission: RE | Admit: 2021-01-21 | Discharge: 2021-01-21 | Disposition: A | Discharge status: Home / Self Care | Payer: Medicare Other | Source: Ambulatory Visit | Attending: Family | Admitting: Family

## 2021-01-21 ENCOUNTER — Encounter: Payer: Self-pay | Admitting: Family

## 2021-01-21 VITALS — BP 114/80 | HR 72 | Temp 98.1°F | Resp 18 | Ht 65.0 in | Wt 147.2 lb

## 2021-01-21 DIAGNOSIS — M47817 Spondylosis without myelopathy or radiculopathy, lumbosacral region: Secondary | ICD-10-CM | POA: Diagnosis not present

## 2021-01-21 DIAGNOSIS — M5432 Sciatica, left side: Secondary | ICD-10-CM | POA: Insufficient documentation

## 2021-01-21 DIAGNOSIS — M2578 Osteophyte, vertebrae: Secondary | ICD-10-CM | POA: Diagnosis not present

## 2021-01-21 DIAGNOSIS — M4697 Unspecified inflammatory spondylopathy, lumbosacral region: Secondary | ICD-10-CM | POA: Diagnosis not present

## 2021-01-21 DIAGNOSIS — M47816 Spondylosis without myelopathy or radiculopathy, lumbar region: Secondary | ICD-10-CM | POA: Diagnosis not present

## 2021-01-21 NOTE — Assessment & Plan Note (Signed)
New. Improving.  Pt is advised as follows:  Please complete x-rays on the first floor. Call if symptoms worsen or if they don't continue to improve. You may use ibuprofen sparingly as needed for pain.

## 2021-01-21 NOTE — Progress Notes (Signed)
Subjective:     Patient ID: Megan Lutz, female    DOB: 09/17/1950, 70 y.o.   MRN: [id number]  No chief complaint on file.   HPI Patient is in today to discuss sciatica. She has been working with a Restaurant manager, fast food. Pain started last Wednesday.  She reports that pain shot down the back of her left leg.  She reports that chiropractor told her to see her PCP for x-rays. Notes that she took a a few ibuprofen.  Pain is improving.    Health Maintenance Due  Topic Date Due   Hepatitis C Screening  Never done   COVID-19 Vaccine (3 - Booster for Pfizer series) 01/01/2020   INFLUENZA VACCINE  11/17/2020   MAMMOGRAM  12/03/2020    Past Medical History:  Diagnosis Date   Allergy    ANEMIA 09/25/2009   Diverticular disease 2013   Hemorrhoid    HYPERLIPIDEMIA 12/07/2007   Kidney stone    OSTEOPENIA 12/07/2007   PERIMENOPAUSAL SYNDROME 12/10/2008    Past Surgical History:  Procedure Laterality Date   COLONOSCOPY     CYSTOSCOPY     ?2000   FRACTURE SURGERY  2000   finger-pins placed. "hard to wake up from anesthesia" per pt   POLYPECTOMY     TONSILLECTOMY      Family History  Problem Relation Age of Onset   Cancer Mother    Cancer Father    Colon cancer Father 64    Social History   Socioeconomic History   Marital status: Divorced    Spouse name: Not on file   Number of children: Not on file   Years of education: Not on file   Highest education level: Not on file  Occupational History   Not on file  Tobacco Use   Smoking status: Never   Smokeless tobacco: Never  Vaping Use   Vaping Use: Never used  Substance and Sexual Activity   Alcohol use: Yes    Comment: occ   Drug use: No   Sexual activity: Not Currently  Other Topics Concern   Not on file  Social History Narrative   retired   Son and daughter- both live locally- has a 28 yr old granddaughter   Divorced- 1998, 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       Calcium Carbonate-Vitamin D 600-400 MG-UNIT tablet Take 1 tablet by mouth 2 (two) times daily.     omeprazole (PRILOSEC) 40 MG capsule Take 1 capsule (40 mg total) by mouth daily. 30 capsule 3   pravastatin (PRAVACHOL) 40 MG tablet Take 1 tablet by mouth once daily 90 tablet 0   Facility-Administered Medications Prior to Visit  Medication Dose Route Frequency Provider Last Rate Last Admin   0.9 %  sodium chloride infusion  500 mL Intravenous Once Nelida Meuse III, MD        Allergies  Allergen Reactions   Codeine Phosphate Nausea And Vomiting    ROS     Objective:    Physical Exam  There were no vitals taken for  this visit. Wt Readings from Last 3 Encounters:  10/29/20 148 lb 9.6 oz (67.4 kg)  10/07/20 147 lb (66.7 kg)  02/06/20 140 lb (63.5 kg)       Assessment & Plan:   Problem List Items Addressed This Visit   None   I am having Megan Robert. Lutz maintain her aspirin, Calcium Carbonate-Vitamin D, omeprazole, and pravastatin. We will continue to administer sodium chloride.  No orders of the defined types were placed in this encounter.

## 2021-01-21 NOTE — Patient Instructions (Signed)
Please complete x-rays on the first floor. Call if symptoms worsen or if they don't continue to improve. You may use ibuprofen sparingly as needed for pain.

## 2021-01-26 DIAGNOSIS — M5432 Sciatica, left side: Secondary | ICD-10-CM | POA: Diagnosis not present

## 2021-01-26 DIAGNOSIS — M5137 Other intervertebral disc degeneration, lumbosacral region: Secondary | ICD-10-CM | POA: Diagnosis not present

## 2021-01-26 DIAGNOSIS — M9905 Segmental and somatic dysfunction of pelvic region: Secondary | ICD-10-CM | POA: Diagnosis not present

## 2021-01-26 DIAGNOSIS — M9903 Segmental and somatic dysfunction of lumbar region: Secondary | ICD-10-CM | POA: Diagnosis not present

## 2021-01-29 ENCOUNTER — Ambulatory Visit
Admission: RE | Admit: 2021-01-29 | Discharge: 2021-01-29 | Disposition: A | Discharge status: Home / Self Care | Payer: Medicare Other | Source: Ambulatory Visit | Attending: Family | Admitting: Family

## 2021-01-29 ENCOUNTER — Other Ambulatory Visit: Payer: Self-pay

## 2021-01-29 DIAGNOSIS — Z0000 Encounter for general adult medical examination without abnormal findings: Secondary | ICD-10-CM

## 2021-01-29 DIAGNOSIS — Z1231 Encounter for screening mammogram for malignant neoplasm of breast: Secondary | ICD-10-CM | POA: Diagnosis not present

## 2021-01-30 DIAGNOSIS — M9905 Segmental and somatic dysfunction of pelvic region: Secondary | ICD-10-CM | POA: Diagnosis not present

## 2021-01-30 DIAGNOSIS — M9903 Segmental and somatic dysfunction of lumbar region: Secondary | ICD-10-CM | POA: Diagnosis not present

## 2021-01-30 DIAGNOSIS — M5137 Other intervertebral disc degeneration, lumbosacral region: Secondary | ICD-10-CM | POA: Diagnosis not present

## 2021-01-30 DIAGNOSIS — M5432 Sciatica, left side: Secondary | ICD-10-CM | POA: Diagnosis not present

## 2021-02-03 DIAGNOSIS — M5137 Other intervertebral disc degeneration, lumbosacral region: Secondary | ICD-10-CM | POA: Diagnosis not present

## 2021-02-03 DIAGNOSIS — H43813 Vitreous degeneration, bilateral: Secondary | ICD-10-CM | POA: Diagnosis not present

## 2021-02-03 DIAGNOSIS — M9905 Segmental and somatic dysfunction of pelvic region: Secondary | ICD-10-CM | POA: Diagnosis not present

## 2021-02-03 DIAGNOSIS — M9903 Segmental and somatic dysfunction of lumbar region: Secondary | ICD-10-CM | POA: Diagnosis not present

## 2021-02-03 DIAGNOSIS — M5432 Sciatica, left side: Secondary | ICD-10-CM | POA: Diagnosis not present

## 2021-02-04 DIAGNOSIS — D225 Melanocytic nevi of trunk: Secondary | ICD-10-CM | POA: Diagnosis not present

## 2021-02-04 DIAGNOSIS — D1801 Hemangioma of skin and subcutaneous tissue: Secondary | ICD-10-CM | POA: Diagnosis not present

## 2021-02-04 DIAGNOSIS — L821 Other seborrheic keratosis: Secondary | ICD-10-CM | POA: Diagnosis not present

## 2021-02-04 DIAGNOSIS — L812 Freckles: Secondary | ICD-10-CM | POA: Diagnosis not present

## 2021-02-04 DIAGNOSIS — D224 Melanocytic nevi of scalp and neck: Secondary | ICD-10-CM | POA: Diagnosis not present

## 2021-02-05 DIAGNOSIS — M5137 Other intervertebral disc degeneration, lumbosacral region: Secondary | ICD-10-CM | POA: Diagnosis not present

## 2021-02-05 DIAGNOSIS — M9903 Segmental and somatic dysfunction of lumbar region: Secondary | ICD-10-CM | POA: Diagnosis not present

## 2021-02-05 DIAGNOSIS — M5432 Sciatica, left side: Secondary | ICD-10-CM | POA: Diagnosis not present

## 2021-02-05 DIAGNOSIS — M9905 Segmental and somatic dysfunction of pelvic region: Secondary | ICD-10-CM | POA: Diagnosis not present

## 2021-02-10 DIAGNOSIS — M9905 Segmental and somatic dysfunction of pelvic region: Secondary | ICD-10-CM | POA: Diagnosis not present

## 2021-02-10 DIAGNOSIS — M9903 Segmental and somatic dysfunction of lumbar region: Secondary | ICD-10-CM | POA: Diagnosis not present

## 2021-02-10 DIAGNOSIS — M5137 Other intervertebral disc degeneration, lumbosacral region: Secondary | ICD-10-CM | POA: Diagnosis not present

## 2021-02-10 DIAGNOSIS — M5432 Sciatica, left side: Secondary | ICD-10-CM | POA: Diagnosis not present

## 2021-02-12 DIAGNOSIS — M9905 Segmental and somatic dysfunction of pelvic region: Secondary | ICD-10-CM | POA: Diagnosis not present

## 2021-02-12 DIAGNOSIS — M5432 Sciatica, left side: Secondary | ICD-10-CM | POA: Diagnosis not present

## 2021-02-12 DIAGNOSIS — M9903 Segmental and somatic dysfunction of lumbar region: Secondary | ICD-10-CM | POA: Diagnosis not present

## 2021-02-12 DIAGNOSIS — M5137 Other intervertebral disc degeneration, lumbosacral region: Secondary | ICD-10-CM | POA: Diagnosis not present

## 2021-02-17 DIAGNOSIS — M9905 Segmental and somatic dysfunction of pelvic region: Secondary | ICD-10-CM | POA: Diagnosis not present

## 2021-02-17 DIAGNOSIS — M5432 Sciatica, left side: Secondary | ICD-10-CM | POA: Diagnosis not present

## 2021-02-17 DIAGNOSIS — M9903 Segmental and somatic dysfunction of lumbar region: Secondary | ICD-10-CM | POA: Diagnosis not present

## 2021-02-17 DIAGNOSIS — M5137 Other intervertebral disc degeneration, lumbosacral region: Secondary | ICD-10-CM | POA: Diagnosis not present

## 2021-02-19 DIAGNOSIS — M5137 Other intervertebral disc degeneration, lumbosacral region: Secondary | ICD-10-CM | POA: Diagnosis not present

## 2021-02-19 DIAGNOSIS — M9903 Segmental and somatic dysfunction of lumbar region: Secondary | ICD-10-CM | POA: Diagnosis not present

## 2021-02-19 DIAGNOSIS — M9905 Segmental and somatic dysfunction of pelvic region: Secondary | ICD-10-CM | POA: Diagnosis not present

## 2021-02-19 DIAGNOSIS — M5432 Sciatica, left side: Secondary | ICD-10-CM | POA: Diagnosis not present

## 2021-02-24 DIAGNOSIS — M9903 Segmental and somatic dysfunction of lumbar region: Secondary | ICD-10-CM | POA: Diagnosis not present

## 2021-02-24 DIAGNOSIS — M5137 Other intervertebral disc degeneration, lumbosacral region: Secondary | ICD-10-CM | POA: Diagnosis not present

## 2021-02-24 DIAGNOSIS — S39012A Strain of muscle, fascia and tendon of lower back, initial encounter: Secondary | ICD-10-CM | POA: Diagnosis not present

## 2021-02-24 DIAGNOSIS — M9905 Segmental and somatic dysfunction of pelvic region: Secondary | ICD-10-CM | POA: Diagnosis not present

## 2021-03-06 DIAGNOSIS — S39012A Strain of muscle, fascia and tendon of lower back, initial encounter: Secondary | ICD-10-CM | POA: Diagnosis not present

## 2021-03-06 DIAGNOSIS — M9903 Segmental and somatic dysfunction of lumbar region: Secondary | ICD-10-CM | POA: Diagnosis not present

## 2021-03-06 DIAGNOSIS — M5137 Other intervertebral disc degeneration, lumbosacral region: Secondary | ICD-10-CM | POA: Diagnosis not present

## 2021-03-06 DIAGNOSIS — M9905 Segmental and somatic dysfunction of pelvic region: Secondary | ICD-10-CM | POA: Diagnosis not present

## 2021-03-24 DIAGNOSIS — M5137 Other intervertebral disc degeneration, lumbosacral region: Secondary | ICD-10-CM | POA: Diagnosis not present

## 2021-03-24 DIAGNOSIS — S39012A Strain of muscle, fascia and tendon of lower back, initial encounter: Secondary | ICD-10-CM | POA: Diagnosis not present

## 2021-03-24 DIAGNOSIS — M9903 Segmental and somatic dysfunction of lumbar region: Secondary | ICD-10-CM | POA: Diagnosis not present

## 2021-03-24 DIAGNOSIS — M9905 Segmental and somatic dysfunction of pelvic region: Secondary | ICD-10-CM | POA: Diagnosis not present

## 2021-05-04 DIAGNOSIS — M9905 Segmental and somatic dysfunction of pelvic region: Secondary | ICD-10-CM | POA: Diagnosis not present

## 2021-05-04 DIAGNOSIS — M5137 Other intervertebral disc degeneration, lumbosacral region: Secondary | ICD-10-CM | POA: Diagnosis not present

## 2021-05-04 DIAGNOSIS — M9903 Segmental and somatic dysfunction of lumbar region: Secondary | ICD-10-CM | POA: Diagnosis not present

## 2021-05-04 DIAGNOSIS — S39012A Strain of muscle, fascia and tendon of lower back, initial encounter: Secondary | ICD-10-CM | POA: Diagnosis not present

## 2021-05-22 NOTE — Progress Notes (Signed)
Subjective:   Megan Lutz is a 71 y.o. female who presents for an Initial Medicare Annual Wellness Visit.  Review of Systems     Cardiac Risk Factors include: advanced age (>27men, >95 women);dyslipidemia     Objective:    Today's Vitals   05/25/21 0859  BP: 122/78  Pulse: 64  Resp: 16  Temp: 97.8 F (36.6 C)  TempSrc: Oral  SpO2: 96%  Weight: 151 lb (68.5 kg)  Height: 5\' 5"  (1.651 m)   Body mass index is 25.13 kg/m.  Advanced Directives 05/25/2021  Does Patient Have a Medical Advance Directive? No  Would patient like information on creating a medical advance directive? Yes (MAU/Ambulatory/Procedural Areas - Information given)    Current Medications (verified) Outpatient Encounter Medications as of 05/25/2021  Medication Sig   aspirin 81 MG tablet Take 81 mg by mouth daily.     Calcium Carbonate-Vitamin D 600-400 MG-UNIT tablet Take 1 tablet by mouth 2 (two) times daily.   pravastatin (PRAVACHOL) 40 MG tablet Take 1 tablet by mouth once daily   [DISCONTINUED] omeprazole (PRILOSEC) 40 MG capsule Take 1 capsule (40 mg total) by mouth daily.   Facility-Administered Encounter Medications as of 05/25/2021  Medication   0.9 %  sodium chloride infusion    Allergies (verified) Codeine phosphate   History: Past Medical History:  Diagnosis Date   Allergy    ANEMIA 09/25/2009   Diverticular disease 2013   Hemorrhoid    HYPERLIPIDEMIA 12/07/2007   Kidney stone    OSTEOPENIA 12/07/2007   PERIMENOPAUSAL SYNDROME 12/10/2008   Past Surgical History:  Procedure Laterality Date   COLONOSCOPY     CYSTOSCOPY     ?2000   FRACTURE SURGERY  2000   finger-pins placed. "hard to wake up from anesthesia" per pt   POLYPECTOMY     TONSILLECTOMY     Family History  Problem Relation Age of Onset   Cancer Mother    Cancer Father    Colon cancer Father 49   Social History   Socioeconomic History   Marital status: Divorced    Spouse name: Not on file   Number of children:  Not on file   Years of education: Not on file   Highest education level: Not on file  Occupational History   Not on file  Tobacco Use   Smoking status: Never   Smokeless tobacco: Never  Vaping Use   Vaping Use: Never used  Substance and Sexual Activity   Alcohol use: Yes    Comment: occ   Drug use: No   Sexual activity: Not Currently  Other Topics Concern   Not on file  Social History Narrative   retired   Son and daughter- both live locally- has a 70 yr old granddaughter   Divorced- 1998, 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[place])   Social Determinants of Radio broadcast assistant Strain: Low Risk    Difficulty of Paying Living Expenses: Not hard at all  Food Insecurity: No Food Insecurity   Worried About Charity fundraiser in the Last Year: Never true   Arboriculturist in the Last Year: Never true  Transportation Needs: No Transportation Needs   Lack of Transportation (Medical): No   Lack of Transportation (Non-Medical): No  Physical Activity: Insufficiently Active   Days of Exercise per Week: 2 days   Minutes of Exercise per Session: 40 min  Stress: No Stress Concern Present   Feeling of Stress : Only a  little  Social Connections: Moderately Isolated   Frequency of Communication with Friends and Family: More than three times a week   Frequency of Social Gatherings with Friends and Family: More than three times a week   Attends Religious Services: More than 4 times per year   Active Member of [employer] or Organizations: No   Attends Music therapist: Never   Marital Status: Divorced    Tobacco Counseling Counseling given: Not Answered   Clinical Intake:  Pre-visit preparation completed: Yes  Pain : No/denies pain     BMI - recorded: 25.13 Nutritional Status: BMI 25 -29 Overweight Diabetes: No  How often do you need to have someone help you when you read instructions, pamphlets, or other written materials from your doctor or pharmacy?: 1 -  Never  Diabetic?No  Interpreter Needed?: No  Information entered by :: Caroleen Hamman LPN   Activities of Daily Living In your present state of health, do you have any difficulty performing the following activities: 05/25/2021 10/29/2020  Hearing? N N  Vision? N N  Difficulty concentrating or making decisions? N N  Walking or climbing stairs? N N  Dressing or bathing? N N  Doing errands, shopping? N N  Preparing Food and eating ? N -  Using the Toilet? N -  In the past six months, have you accidently leaked urine? N -  Do you have problems with loss of bowel control? N -  Managing your Medications? N -  Managing your Finances? N -  Housekeeping or managing your Housekeeping? N -  Some recent data might be hidden    Patient Care Team: [name], NP as PCP - General (Internal Medicine)  Indicate any recent Medical Services you may have received from other than [facility] providers in the past year (date may be approximate).     Assessment:   This is a routine wellness examination for Megan Lutz.  Hearing/Vision screen Hearing Screening - Comments:: No issues Vision Screening - Comments:: Last eye exam-05/2020-Dr. Delman Cheadle  Dietary issues and exercise activities discussed: Current Exercise Habits: Home exercise routine, Type of exercise: walking, Time (Minutes): 30, Frequency (Times/Week): 2, Weekly Exercise (Minutes/Week): 60, Intensity: Mild, Exercise limited by: None identified   Goals Addressed             This Visit's Progress    Patient Stated       Drink more water & increase activity       Depression Screen PHQ 2/9 Scores 05/25/2021 10/07/2020 06/11/2019 02/06/2016 12/30/2014 12/17/2013  PHQ - 2 Score 1 0 0 0 0 0    Fall Risk Fall Risk  05/25/2021 10/07/2020 06/11/2019 12/30/2014  Falls in the past year? 0 1 0 No  Number falls in past yr: 0 0 0 -  Injury with Fall? 0 1 0 -  Follow up Falls prevention discussed - - -    FALL RISK PREVENTION PERTAINING TO THE  HOME:  Any stairs in or around the home? Yes  If so, are there any without handrails? No  Home free of loose throw rugs in walkways, pet beds, electrical cords, etc? Yes  Adequate lighting in your home to reduce risk of falls? Yes   ASSISTIVE DEVICES UTILIZED TO PREVENT FALLS:  Life alert? No  Use of a cane, walker or w/c? No  Grab bars in the bathroom? Yes  Shower chair or bench in shower? Yes  Elevated toilet seat or a handicapped toilet? No   TIMED UP AND GO:  Was the test performed? Yes .  Length of time to ambulate 10 feet: 11 sec.   Gait steady and fast without use of assistive device  Cognitive Function:Normal cognitive status assessed by direct observation by this Nurse Health Advisor. No abnormalities found.   MMSE - Mini Mental State Exam 10/15/2019  Orientation to time 5  Orientation to Place 5  Registration 3  Attention/ Calculation 5  Recall 3  Language- name 2 objects 2  Language- repeat 1  Language- follow 3 step command 3  Language- read & follow direction 1  Write a sentence 1  Copy design 1  Total score 30        Immunizations Immunization History  Administered Date(s) Administered   Fluad Quad(high Dose 65+) 01/18/2019   Influenza Split 04/06/2011, 01/14/2012   Influenza Whole 04/02/2010   Influenza, High Dose Seasonal PF 02/07/2017, 02/08/2018   Influenza,inj,Quad PF,6+ Mos 12/30/2014, 02/06/2016   Influenza-Unspecified 03/19/2021   PFIZER(Purple Top)SARS-COV-2 Vaccination 07/09/2019, 08/01/2019   Pneumococcal Conjugate-13 02/07/2017   Pneumococcal Polysaccharide-23 06/11/2019   Tdap 12/17/2013   Zoster Recombinat (Shingrix) 06/11/2019, 10/15/2019    TDAP status: Up to date  Flu Vaccine status: Up to date  Pneumococcal vaccine status: Up to date  Covid-19 vaccine status: Information provided on how to obtain vaccines.   Qualifies for Shingles Vaccine? No   Zostavax completed No   Shingrix Completed?: Yes  Screening Tests Health  Maintenance  Topic Date Due   Hepatitis C Screening  Never done   COVID-19 Vaccine (3 - Booster for Pfizer series) 09/26/2019   MAMMOGRAM  01/29/2022   COLONOSCOPY (Pts 45-56yrs Insurance coverage will need to be confirmed)  03/25/2022   TETANUS/TDAP  12/18/2023   Pneumonia Vaccine 76+ Years old  Completed   INFLUENZA VACCINE  Completed   DEXA SCAN  Completed   Zoster Vaccines- Shingrix  Completed   HPV VACCINES  Aged Out    Health Maintenance  Health Maintenance Due  Topic Date Due   Hepatitis C Screening  Never done   COVID-19 Vaccine (3 - Booster for [employer] series) 09/26/2019    Colorectal cancer screening: Type of screening: Colonoscopy. Completed 03/25/2017. Repeat every 5 years  Mammogram status: Completed bilateral 01/29/2021. Repeat every year  Bone Density status: Ordered today. Pt provided with contact info and advised to call to schedule appt.  Lung Cancer Screening: (Low Dose CT Chest recommended if Age 62-80 years, 30 pack-year currently smoking OR have quit w/in 15years.) does not qualify.     Additional Screening:  Hepatitis C Screening: does qualify;   Vision Screening: Recommended annual ophthalmology exams for early detection of glaucoma and other disorders of the eye. Is the patient up to date with their annual eye exam?  Yes  Who is the provider or what is the name of the office in which the patient attends annual eye exams? Dr. Delman Cheadle   Dental Screening: Recommended annual dental exams for proper oral hygiene  Community Resource Referral / Chronic Care Management: CRR required this visit?  No   CCM required this visit?  No      Plan:     I have personally reviewed and noted the following in the patients chart:   Medical and social history Use of alcohol, tobacco or illicit drugs  Current medications and supplements including opioid prescriptions. Patient is not currently taking opioid prescriptions. Functional ability and  status Nutritional status Physical activity Advanced directives List of other physicians Hospitalizations, surgeries, and ER visits in  previous 12 months Vitals Screenings to include cognitive, depression, and falls Referrals and appointments  In addition, I have reviewed and discussed with patient certain preventive protocols, quality metrics, and best practice recommendations. A written personalized care plan for preventive services as well as general preventive health recommendations were provided to patient.     Marta Antu, LPN   07/18/7528  Nurse Health Advisor  Nurse Notes: None

## 2021-05-25 ENCOUNTER — Ambulatory Visit (INDEPENDENT_AMBULATORY_CARE_PROVIDER_SITE_OTHER): Payer: Medicare Other

## 2021-05-25 VITALS — BP 122/78 | HR 64 | Temp 97.8°F | Resp 16 | Ht 65.0 in | Wt 151.0 lb

## 2021-05-25 DIAGNOSIS — Z0000 Encounter for general adult medical examination without abnormal findings: Secondary | ICD-10-CM

## 2021-05-25 DIAGNOSIS — Z1231 Encounter for screening mammogram for malignant neoplasm of breast: Secondary | ICD-10-CM

## 2021-05-25 NOTE — Patient Instructions (Signed)
Ms. Richwine , Thank you for taking time to come for your Medicare Wellness Visit. I appreciate your ongoing commitment to your health goals. Please review the following plan we discussed and let me know if I can assist you in the future.   Screening recommendations/referrals: Colonoscopy: Completed 03/25/2017-Due 03/25/2022 Mammogram: Completed 01/29/2021-Due 01/29/2022 Bone Density: Ordered today. Someone will call you to schedule. Recommended yearly ophthalmology/optometry visit for glaucoma screening and checkup Recommended yearly dental visit for hygiene and checkup  Vaccinations: Influenza vaccine: Up to date Pneumococcal vaccine: Up to date Tdap vaccine: Up to date Shingles vaccine: Completed vaccines   Covid-19:Booster available at the pharmacy  Advanced directives: Information given today  Conditions/risks identified: See problem list  Next appointment: Follow up in one year for your annual wellness visit 05/27/2022 @ 9:00.   Preventive Care 104 Years and Older, Female Preventive care refers to lifestyle choices and visits with your health care provider that can promote health and wellness. What does preventive care include? A yearly physical exam. This is also called an annual well check. Dental exams once or twice a year. Routine eye exams. Ask your health care provider how often you should have your eyes checked. Personal lifestyle choices, including: Daily care of your teeth and gums. Regular physical activity. Eating a healthy diet. Avoiding tobacco and drug use. Limiting alcohol use. Practicing safe sex. Taking low-dose aspirin every day. Taking vitamin and mineral supplements as recommended by your health care provider. What happens during an annual well check? The services and screenings done by your health care provider during your annual well check will depend on your age, overall health, lifestyle risk factors, and family history of disease. Counseling  Your  health care provider may ask you questions about your: Alcohol use. Tobacco use. Drug use. Emotional well-being. Home and relationship well-being. Sexual activity. Eating habits. History of falls. Memory and ability to understand (cognition). Work and work Statistician. Reproductive health. Screening  You may have the following tests or measurements: Height, weight, and BMI. Blood pressure. Lipid and cholesterol levels. These may be checked every 5 years, or more frequently if you are over 66 years old. Skin check. Lung cancer screening. You may have this screening every year starting at age 43 if you have a 30-pack-year history of smoking and currently smoke or have quit within the past 15 years. Fecal occult blood test (FOBT) of the stool. You may have this test every year starting at age 45. Flexible sigmoidoscopy or colonoscopy. You may have a sigmoidoscopy every 5 years or a colonoscopy every 10 years starting at age 63. Hepatitis C blood test. Hepatitis B blood test. Sexually transmitted disease (STD) testing. Diabetes screening. This is done by checking your blood sugar (glucose) after you have not eaten for a while (fasting). You may have this done every 1-3 years. Bone density scan. This is done to screen for osteoporosis. You may have this done starting at age 44. Mammogram. This may be done every 1-2 years. Talk to your health care provider about how often you should have regular mammograms. Talk with your health care provider about your test results, treatment options, and if necessary, the need for more tests. Vaccines  Your health care provider may recommend certain vaccines, such as: Influenza vaccine. This is recommended every year. Tetanus, diphtheria, and acellular pertussis (Tdap, Td) vaccine. You may need a Td booster every 10 years. Zoster vaccine. You may need this after age 76. Pneumococcal 13-valent conjugate (PCV13) vaccine. One dose  is recommended after age  47. Pneumococcal polysaccharide (PPSV23) vaccine. One dose is recommended after age 104. Talk to your health care provider about which screenings and vaccines you need and how often you need them. This information is not intended to replace advice given to you by your health care provider. Make sure you discuss any questions you have with your health care provider. Document Released: 05/02/2015 Document Revised: 12/24/2015 Document Reviewed: 02/04/2015 Elsevier Interactive Patient Education  2017 [employer] Prevention in the Home Falls can cause injuries. They can happen to people of all ages. There are many things you can do to make your home safe and to help prevent falls. What can I do on the outside of my home? Regularly fix the edges of walkways and driveways and fix any cracks. Remove anything that might make you trip as you walk through a door, such as a raised step or threshold. Trim any bushes or trees on the path to your home. Use bright outdoor lighting. Clear any walking paths of anything that might make someone trip, such as rocks or tools. Regularly check to see if handrails are loose or broken. Make sure that both sides of any steps have handrails. Any raised decks and porches should have guardrails on the edges. Have any leaves, snow, or ice cleared regularly. Use sand or salt on walking paths during winter. Clean up any spills in your garage right away. This includes oil or grease spills. What can I do in the bathroom? Use night lights. Install grab bars by the toilet and in the tub and shower. Do not use towel bars as grab bars. Use non-skid mats or decals in the tub or shower. If you need to sit down in the shower, use a plastic, non-slip stool. Keep the floor dry. Clean up any water that spills on the floor as soon as it happens. Remove soap buildup in the tub or shower regularly. Attach bath mats securely with double-sided non-slip rug tape. Do not have throw  rugs and other things on the floor that can make you trip. What can I do in the bedroom? Use night lights. Make sure that you have a light by your bed that is easy to reach. Do not use any sheets or blankets that are too big for your bed. They should not hang down onto the floor. Have a firm chair that has side arms. You can use this for support while you get dressed. Do not have throw rugs and other things on the floor that can make you trip. What can I do in the kitchen? Clean up any spills right away. Avoid walking on wet floors. Keep items that you use a lot in easy-to-reach places. If you need to reach something above you, use a strong step stool that has a grab bar. Keep electrical cords out of the way. Do not use floor polish or wax that makes floors slippery. If you must use wax, use non-skid floor wax. Do not have throw rugs and other things on the floor that can make you trip. What can I do with my stairs? Do not leave any items on the stairs. Make sure that there are handrails on both sides of the stairs and use them. Fix handrails that are broken or loose. Make sure that handrails are as long as the stairways. Check any carpeting to make sure that it is firmly attached to the stairs. Fix any carpet that is loose or worn. Avoid  having throw rugs at the top or bottom of the stairs. If you do have throw rugs, attach them to the floor with carpet tape. Make sure that you have a light switch at the top of the stairs and the bottom of the stairs. If you do not have them, ask someone to add them for you. What else can I do to help prevent falls? Wear shoes that: Do not have high heels. Have rubber bottoms. Are comfortable and fit you well. Are closed at the toe. Do not wear sandals. If you use a stepladder: Make sure that it is fully opened. Do not climb a closed stepladder. Make sure that both sides of the stepladder are locked into place. Ask someone to hold it for you, if  possible. Clearly mark and make sure that you can see: Any grab bars or handrails. First and last steps. Where the edge of each step is. Use tools that help you move around (mobility aids) if they are needed. These include: Canes. Walkers. Scooters. Crutches. Turn on the lights when you go into a dark area. Replace any light bulbs as soon as they burn out. Set up your furniture so you have a clear path. Avoid moving your furniture around. If any of your floors are uneven, fix them. If there are any pets around you, be aware of where they are. Review your medicines with your doctor. Some medicines can make you feel dizzy. This can increase your chance of falling. Ask your doctor what other things that you can do to help prevent falls. This information is not intended to replace advice given to you by your health care provider. Make sure you discuss any questions you have with your health care provider. Document Released: 01/30/2009 Document Revised: 09/11/2015 Document Reviewed: 05/10/2014 Elsevier Interactive Patient Education  2017 [employer].

## 2021-06-04 DIAGNOSIS — H5203 Hypermetropia, bilateral: Secondary | ICD-10-CM | POA: Diagnosis not present

## 2021-06-05 ENCOUNTER — Telehealth: Payer: Self-pay | Admitting: Family

## 2021-06-05 NOTE — Telephone Encounter (Signed)
Medication: pravastatin (PRAVACHOL) 40 MG tablet   Has the patient contacted their pharmacy? Yes.    Preferred Pharmacy (with phone number or street name):  [place], [place] - [place]  [address] [place] [address]  Phone:  [phone]  Fax:  [phone]   Agent: Please be advised that RX refills may take up to 3 business days. We ask that you follow-up with your pharmacy.

## 2021-06-08 ENCOUNTER — Other Ambulatory Visit: Payer: Self-pay

## 2021-06-08 DIAGNOSIS — E785 Hyperlipidemia, unspecified: Secondary | ICD-10-CM

## 2021-06-08 MED ORDER — PRAVASTATIN SODIUM 40 MG PO TABS: 40.0000 mg | ORAL_TABLET | Freq: Every day | ORAL | 1 refills | Status: DC

## 2021-06-08 NOTE — Telephone Encounter (Signed)
Refill sent- utd on ov's

## 2021-06-10 ENCOUNTER — Other Ambulatory Visit: Payer: Self-pay | Admitting: Family

## 2021-06-10 DIAGNOSIS — E2839 Other primary ovarian failure: Secondary | ICD-10-CM

## 2021-06-23 ENCOUNTER — Ambulatory Visit (INDEPENDENT_AMBULATORY_CARE_PROVIDER_SITE_OTHER): Payer: Medicare Other | Admitting: Family

## 2021-06-23 VITALS — BP 126/76 | HR 76 | Temp 98.5°F | Resp 16 | Wt 151.0 lb

## 2021-06-23 DIAGNOSIS — R051 Acute cough: Secondary | ICD-10-CM

## 2021-06-23 MED ORDER — BENZONATATE 100 MG PO CAPS: 100.0000 mg | ORAL_CAPSULE | Freq: Three times a day (TID) | ORAL | 0 refills | Status: DC | PRN

## 2021-06-23 MED ORDER — LUTEIN-ZEAXANTHIN 15-0.7 MG PO CAPS: 1.0000 | ORAL_CAPSULE | Freq: Every day | ORAL | 0 refills | Status: AC

## 2021-06-23 NOTE — Progress Notes (Signed)
? ?Subjective:  ? ?By signing my name below, I, Megan Lutz, attest that this documentation has been prepared under the direction and in the presence of Megan Alar NP, 06/23/2021   ? ? Patient ID: Megan Lutz, female    DOB: [birth year], 71 y.o.   MRN: [id number] ? ?Chief Complaint  ?Patient presents with  ? Cough  ?  Complains of productive cough. Started 06-15-21.    ? ? ?HPI ?Patient is in today for an office visit. ? ?Cough - Patient complains of a cough that appeared last week. She believes that she developed her symptoms from her daughter. She reports that her cough has been getting better. She is interested in getting swabbed for COVID - 19.  ? ?Vision - Patient is currently taking lutein and zeaxanthin for vision which her eye doctor prescribed. ? ?Health Maintenance Due  ?Topic Date Due  ? Hepatitis C Screening  Never done  ? COVID-19 Vaccine (3 - Booster for Pfizer series) 09/26/2019  ? ? ?Past Medical History:  ?Diagnosis Date  ? Allergy   ? ANEMIA 09/25/2009  ? Diverticular disease 2013  ? Hemorrhoid   ? HYPERLIPIDEMIA 12/07/2007  ? Kidney stone   ? OSTEOPENIA 12/07/2007  ? PERIMENOPAUSAL SYNDROME 12/10/2008  ? ? ?Past Surgical History:  ?Procedure Laterality Date  ? COLONOSCOPY    ? CYSTOSCOPY    ? ?2000  ? FRACTURE SURGERY  2000  ? finger-pins placed. "hard to wake up from anesthesia" per pt  ? POLYPECTOMY    ? TONSILLECTOMY    ? ? ?Family History  ?Problem Relation Age of Onset  ? Cancer Mother   ? Cancer Father   ? Colon cancer Father 27  ? ? ?Social History  ? ?Socioeconomic History  ? Marital status: Divorced  ?  Spouse name: Not on file  ? Number of children: Not on file  ? Years of education: Not on file  ? Highest education level: Not on file  ?Occupational History  ? Not on file  ?Tobacco Use  ? Smoking status: Never  ? Smokeless tobacco: Never  ?Vaping Use  ? Vaping Use: Never used  ?Substance and Sexual Activity  ? Alcohol use: Yes  ?  Comment: occ  ? Drug use: No  ? Sexual  activity: Not Currently  ?Other Topics Concern  ? Not on file  ?Social History Narrative  ? retired  ? Son and daughter- both live locally- has a 10 yr old granddaughter  ? Divorced- 1998, Engaged (fiance lives in [place])  ? ?Social Determinants of Health  ? ?Financial Resource Strain: Low Risk   ? Difficulty of Paying Living Expenses: Not hard at all  ?Food Insecurity: No Food Insecurity  ? Worried About Charity fundraiser in the Last Year: Never true  ? Ran Out of Food in the Last Year: Never true  ?Transportation Needs: No Transportation Needs  ? Lack of Transportation (Medical): No  ? Lack of Transportation (Non-Medical): No  ?Physical Activity: Insufficiently Active  ? Days of Exercise per Week: 2 days  ? Minutes of Exercise per Session: 40 min  ?Stress: No Stress Concern Present  ? Feeling of Stress : Only a little  ?Social Connections: Moderately Isolated  ? Frequency of Communication with Friends and Family: More than three times a week  ? Frequency of Social Gatherings with Friends and Family: More than three times a week  ? Attends Religious Services: More than 4 times per year  ? Active  Member of Clubs or Organizations: No  ? Attends Archivist Meetings: Never  ? Marital Status: Divorced  ?Intimate Partner Violence: Not At Risk  ? Fear of Current or Ex-Partner: No  ? Emotionally Abused: No  ? Physically Abused: No  ? Sexually Abused: No  ? ? ?Outpatient Medications Prior to Visit  ?Medication Sig Dispense Refill  ? aspirin 81 MG tablet Take 81 mg by mouth daily.      ? Calcium Carbonate-Vitamin D 600-400 MG-UNIT tablet Take 1 tablet by mouth 2 (two) times daily.    ? pravastatin (PRAVACHOL) 40 MG tablet Take 1 tablet (40 mg total) by mouth daily. 90 tablet 1  ? ?Facility-Administered Medications Prior to Visit  ?Medication Dose Route Frequency Provider Last Rate Last Admin  ? 0.9 %  sodium chloride infusion  500 mL Intravenous Once Doran Stabler, MD      ? ? ?Allergies  ?Allergen Reactions  ?  Codeine Phosphate Nausea And Vomiting  ? ? ?Review of Systems  ?Constitutional:  Positive for fever.  ?Respiratory:  Positive for cough.   ? ?   ?Objective:  ?  ?Physical Exam ?Constitutional:   ?   General: She is not in acute distress. ?   Appearance: Normal appearance. She is not ill-appearing.  ?HENT:  ?   Head: Normocephalic and atraumatic.  ?   Right Ear: Tympanic membrane, ear canal and external ear normal.  ?   Left Ear: Ear canal and external ear normal. A middle ear effusion (Clear Fluid) is present.  ?Eyes:  ?   Extraocular Movements: Extraocular movements intact.  ?   Pupils: Pupils are equal, round, and reactive to light.  ?Cardiovascular:  ?   Rate and Rhythm: Normal rate and regular rhythm.  ?   Heart sounds: Normal heart sounds. No murmur heard. ?  No gallop.  ?Pulmonary:  ?   Effort: Pulmonary effort is normal. No respiratory distress.  ?   Breath sounds: Normal breath sounds. No wheezing or rales.  ?Lymphadenopathy:  ?   Cervical: No cervical adenopathy.  ?Skin: ?   General: Skin is warm and dry.  ?Neurological:  ?   Mental Status: She is alert and oriented to person, place, and time.  ?Psychiatric:     ?   Judgment: Judgment normal.  ? ? ?BP 126/76 (BP Location: Right Arm, Patient Position: Sitting, Cuff Size: Small)   Pulse 76   Temp 98.5 ?F (36.9 ?C) (Oral)   Resp 16   Wt 151 lb (68.5 kg)   SpO2 98%   BMI 25.13 kg/m?  ?Wt Readings from Last 3 Encounters:  ?06/23/21 151 lb (68.5 kg)  ?05/25/21 151 lb (68.5 kg)  ?01/21/21 147 lb 3.2 oz (66.8 kg)  ? ? ?   ?Assessment & Plan:  ? ?Problem List Items Addressed This Visit   ? ?  ? Unprioritized  ? Cough - Primary  ?  Suspect viral URI with cough. Covid testing is negative. Will obtain CXR to rule out pneumonia. Otherwise, pt is advised as follows: ? ?Please complete chest x-ray on the first floor.  ?You may use tessalon as needed for cough. ?Call if symptoms do not continue to improve or if you develop new symptoms.  ?  ?  ? Relevant Orders  ? DG  Chest 2 View  ? ? ? ? ?Meds ordered this encounter  ?Medications  ? benzonatate (TESSALON) 100 MG capsule  ?  Sig: Take 1 capsule (100  mg total) by mouth 3 (three) times daily as needed.  ?  Dispense:  20 capsule  ?  Refill:  0  ?  Order Specific Question:   Supervising Provider  ?  Answer:   Penni Homans A [[id number]]  ? Lutein-Zeaxanthin 15-0.7 MG CAPS  ?  Sig: Take 1 capsule by mouth daily.  ?  Refill:  0  ?  Order Specific Question:   Supervising Provider  ?  Answer:   Penni Homans A [[id number]]  ? ? ?I, Nance Pear, NP, personally preformed the services described in this documentation.  All medical record entries made by the scribe were at my direction and in my presence.  I have reviewed the chart and discharge instructions (if applicable) and agree that the record reflects my personal performance and is accurate and complete. 06/23/2021 ? ?I,Amber Collins,acting as a Education administrator for [employer], NP.,have documented all relevant documentation on the behalf of Nance Pear, NP,as directed by  Nance Pear, NP while in the presence of Nance Pear, NP. ? ? ?Nance Pear, NP ? ?

## 2021-06-23 NOTE — Patient Instructions (Signed)
Please complete chest x-ray on the first floor.  ?You may use tessalon as needed for cough. ?Call if symptoms do not continue to improve or if you develop new symptoms.  ?

## 2021-06-23 NOTE — Assessment & Plan Note (Addendum)
Suspect viral URI with cough. Covid testing is negative. Will obtain CXR to rule out pneumonia. Otherwise, pt is advised as follows: ? ?Please complete chest x-ray on the first floor.  ?You may use tessalon as needed for cough. ?Call if symptoms do not continue to improve or if you develop new symptoms.  ?

## 2021-06-24 ENCOUNTER — Ambulatory Visit (HOSPITAL_BASED_OUTPATIENT_CLINIC_OR_DEPARTMENT_OTHER)
Admission: RE | Admit: 2021-06-24 | Discharge: 2021-06-24 | Disposition: A | Discharge status: Home / Self Care | Payer: Medicare Other | Source: Ambulatory Visit | Attending: Family | Admitting: Family

## 2021-06-24 ENCOUNTER — Other Ambulatory Visit: Payer: Self-pay

## 2021-06-24 DIAGNOSIS — R051 Acute cough: Secondary | ICD-10-CM | POA: Diagnosis not present

## 2021-06-24 DIAGNOSIS — R059 Cough, unspecified: Secondary | ICD-10-CM | POA: Diagnosis not present

## 2021-06-25 ENCOUNTER — Telehealth: Payer: Self-pay

## 2021-06-25 MED ORDER — ALBUTEROL SULFATE HFA 108 (90 BASE) MCG/ACT IN AERS: 2.0000 | INHALATION_SPRAY | Freq: Four times a day (QID) | RESPIRATORY_TRACT | 0 refills | Status: DC | PRN

## 2021-06-25 NOTE — Addendum Note (Signed)
Addended by: [name] on: 06/25/2021 02:36 PM ? ? Modules accepted: Orders ? ?

## 2021-06-25 NOTE — Telephone Encounter (Signed)
I have spoken to the pt and she is happy about the x-ray coming back neg. She feels better that it is not pneumonia. She reports that she is feeling a bit better but she wants to know what else she can do for the cough.  ?

## 2021-06-26 NOTE — Telephone Encounter (Signed)
Pt would like to know if there is a diagnosis what she has. She states she has had bronchitis in the past, and it could be that. She also wanted to know why she was prescribed albuterol for this as she has a cough, not trouble breathing. Please advise.  ?

## 2021-06-28 NOTE — Telephone Encounter (Signed)
She likely has viral bronchitis. This can cause restriction of the bronchial tubes which can cause cough. Albuterol can help open her airway and also hopefully help with her cough. ?

## 2021-06-29 NOTE — Telephone Encounter (Signed)
LVM for patient to call back about her question ?

## 2021-07-01 NOTE — Telephone Encounter (Signed)
Patient called back and advised of the use of albuterol. She complains of still having cough and using the medications as indicated.  ?

## 2021-11-04 ENCOUNTER — Other Ambulatory Visit: Payer: Medicare Other

## 2021-12-03 DIAGNOSIS — H43813 Vitreous degeneration, bilateral: Secondary | ICD-10-CM | POA: Diagnosis not present

## 2021-12-03 DIAGNOSIS — H353131 Nonexudative age-related macular degeneration, bilateral, early dry stage: Secondary | ICD-10-CM | POA: Diagnosis not present

## 2021-12-03 DIAGNOSIS — H2513 Age-related nuclear cataract, bilateral: Secondary | ICD-10-CM | POA: Diagnosis not present

## 2022-01-13 ENCOUNTER — Ambulatory Visit (INDEPENDENT_AMBULATORY_CARE_PROVIDER_SITE_OTHER): Payer: Medicare Other | Admitting: Family Medicine

## 2022-01-13 ENCOUNTER — Encounter: Payer: Self-pay | Admitting: Family Medicine

## 2022-01-13 VITALS — BP 112/70 | HR 75 | Temp 98.2°F | Resp 18 | Ht 65.0 in | Wt 149.4 lb

## 2022-01-13 DIAGNOSIS — J069 Acute upper respiratory infection, unspecified: Secondary | ICD-10-CM

## 2022-01-13 LAB — POC COVID19 BINAXNOW

## 2022-01-13 MED ORDER — PROMETHAZINE-DM 6.25-15 MG/5ML PO SYRP: 5.0000 mL | ORAL_SOLUTION | Freq: Four times a day (QID) | ORAL | 0 refills | Status: DC | PRN

## 2022-01-13 NOTE — Progress Notes (Signed)
Chief Complaint  Patient presents with   Cough    Pt states having cough and start x1 week ago. Non productive. Pt states having fatigue. No OTC meds or COVID test    Megan Lutz here for URI complaints.  Duration: 1 week  Associated symptoms: sinus congestion, rhinorrhea, and fatigue, cough Denies: sinus pain, itchy watery eyes, ear pain, ear drainage, sore throat, wheezing, shortness of breath, myalgia, and fevers Treatment to date: Tylenol, Delsym Sick contacts: Yes; niece   Past Medical History:  Diagnosis Date   Allergy    ANEMIA 09/25/2009   Diverticular disease 2013   Hemorrhoid    HYPERLIPIDEMIA 12/07/2007   Kidney stone    OSTEOPENIA 12/07/2007   PERIMENOPAUSAL SYNDROME 12/10/2008    Objective BP 112/70 (BP Location: Left Arm, Patient Position: Sitting, Cuff Size: Normal)   Pulse 75   Temp 98.2 F (36.8 C) (Oral)   Resp 18   Ht '5\' 5"'$  (1.651 m)   Wt 149 lb 6.4 oz (67.8 kg)   SpO2 97%   BMI 24.86 kg/m  General: Awake, alert, appears stated age HEENT: AT, [place], ears patent b/l and TM's neg, nares patent w clear rhinorrhea, pharynx pink and without exudates, MMM Neck: No masses or asymmetry Heart: RRR Lungs: CTAB, no accessory muscle use Psych: Age appropriate judgment and insight, normal mood and affect  Viral URI with cough - Plan: promethazine-dextromethorphan (PROMETHAZINE-DM) 6.25-15 MG/5ML syrup  Covid test neg. Continue to push fluids, practice good hand hygiene, cover mouth when coughing. F/u prn. If starting to experience fevers, shaking, or shortness of breath, seek immediate care. Pt voiced understanding and agreement to the plan.  [place], DO 01/13/22 10:27 AM

## 2022-01-13 NOTE — Patient Instructions (Signed)
Continue to push fluids, practice good hand hygiene, and cover your mouth if you cough. ? ?If you start having fevers, shaking or shortness of breath, seek immediate care. ? ?OK to take Tylenol 1000 mg (2 extra strength tabs) or 975 mg (3 regular strength tabs) every 6 hours as needed. ? ?Let us know if you need anything. ?

## 2022-01-18 ENCOUNTER — Other Ambulatory Visit: Payer: Self-pay | Admitting: Family

## 2022-01-18 DIAGNOSIS — E785 Hyperlipidemia, unspecified: Secondary | ICD-10-CM

## 2022-03-22 ENCOUNTER — Other Ambulatory Visit: Payer: Self-pay | Admitting: Family

## 2022-03-22 DIAGNOSIS — Z1231 Encounter for screening mammogram for malignant neoplasm of breast: Secondary | ICD-10-CM

## 2022-04-09 ENCOUNTER — Other Ambulatory Visit: Payer: Self-pay | Admitting: Family

## 2022-04-09 ENCOUNTER — Inpatient Hospital Stay: Admission: RE | Admit: 2022-04-09 | Payer: Medicare Other | Source: Ambulatory Visit

## 2022-04-09 DIAGNOSIS — E2839 Other primary ovarian failure: Secondary | ICD-10-CM

## 2022-04-11 ENCOUNTER — Ambulatory Visit
Admission: EM | Admit: 2022-04-11 | Discharge: 2022-04-11 | Disposition: A | Discharge status: Home / Self Care | Payer: Medicare Other | Attending: Urgent Care | Admitting: Urgent Care

## 2022-04-11 DIAGNOSIS — B9789 Other viral agents as the cause of diseases classified elsewhere: Secondary | ICD-10-CM | POA: Diagnosis not present

## 2022-04-11 DIAGNOSIS — J988 Other specified respiratory disorders: Secondary | ICD-10-CM

## 2022-04-11 DIAGNOSIS — E860 Dehydration: Secondary | ICD-10-CM | POA: Diagnosis not present

## 2022-04-11 LAB — POCT URINALYSIS DIP (MANUAL ENTRY)
Glucose, UA: 100 mg/dL — AB
Leukocytes, UA: NEGATIVE
Nitrite, UA: NEGATIVE
Protein Ur, POC: 100 mg/dL — AB
Spec Grav, UA: >=1.03 — AB (ref 1.010–1.025)
Urobilinogen, UA: 1 E.U./dL
pH, UA: 5.5 (ref 5.0–8.0)

## 2022-04-11 MED ORDER — PROMETHAZINE-DM 6.25-15 MG/5ML PO SYRP: 2.5000 mL | ORAL_SOLUTION | Freq: Three times a day (TID) | ORAL | 0 refills | Status: DC | PRN

## 2022-04-11 MED ORDER — CETIRIZINE HCL 10 MG PO TABS: 10.0000 mg | ORAL_TABLET | Freq: Every day | ORAL | 0 refills | Status: DC

## 2022-04-11 MED ORDER — PSEUDOEPHEDRINE HCL 30 MG PO TABS: 30.0000 mg | ORAL_TABLET | Freq: Three times a day (TID) | ORAL | 0 refills | Status: DC | PRN

## 2022-04-11 NOTE — Discharge Instructions (Signed)
We will manage this as a viral syndrome. For sore throat or cough try using a honey-based tea. Use 3 teaspoons of honey with juice squeezed from half lemon. Place shaved pieces of ginger into 1/2-1 cup of water and warm over stove top. Then mix the ingredients and repeat every 4 hours as needed. Please take Tylenol '500mg'$ -'650mg'$  once every 6 hours for fevers, aches and pains. Hydrate very well with at least 2 liters (64 ounces) of water. Eat light meals such as soups (chicken and noodles, chicken wild rice, vegetable).  Do not eat any foods that you are allergic to.  Start an antihistamine like Zyrtec for postnasal drainage, sinus congestion.  You can take this together with pseudoephedrine (Sudafed) at a dose of 30 mg 3 times a day or twice daily as needed for the same kind of congestion.  You can use the cough syrup as needed.

## 2022-04-11 NOTE — ED Provider Notes (Signed)
[facility]  Note:  This document was prepared using Systems analyst and may include unintentional dictation errors.  MRN: [id number] DOB: 1950-07-20  Subjective:   Megan Lutz is a 71 y.o. female presenting for 3 to 4-day history of acute onset persistent sinus drainage, congestion, coughing.  Has had multiple sick contacts at home.  No concern for COVID-19.  No chest pain, shortness of breath or wheezing.  Patient has felt dizzy and "out of it".  No history of UTIs.  Has had a history of dizziness and dehydration.  Patient admits that she still does not hydrate very well.   Current Facility-Administered Medications:    0.9 %  sodium chloride infusion, 500 mL, Intravenous, Once, Danis, Kirke Corin, MD  Current Outpatient Medications:    albuterol (VENTOLIN HFA) 108 (90 Base) MCG/ACT inhaler, Inhale 2 puffs into the lungs every 6 (six) hours as needed for wheezing or shortness of breath., Disp: 8 g, Rfl: 0   aspirin 81 MG tablet, Take 81 mg by mouth daily.  , Disp: , Rfl:    Calcium Carbonate-Vitamin D 600-400 MG-UNIT tablet, Take 1 tablet by mouth 2 (two) times daily., Disp: , Rfl:    Lutein-Zeaxanthin 15-0.7 MG CAPS, Take 1 capsule by mouth daily., Disp: , Rfl: 0   pravastatin (PRAVACHOL) 40 MG tablet, Take 1 tablet by mouth once daily, Disp: 90 tablet, Rfl: 0   promethazine-dextromethorphan (PROMETHAZINE-DM) 6.25-15 MG/5ML syrup, Take 5 mLs by mouth 4 (four) times daily as needed for cough., Disp: 118 mL, Rfl: 0   Allergies  Allergen Reactions   Codeine Phosphate Nausea And Vomiting    Past Medical History:  Diagnosis Date   Allergy    ANEMIA 09/25/2009   Diverticular disease 2013   Hemorrhoid    HYPERLIPIDEMIA 12/07/2007   Kidney stone    OSTEOPENIA 12/07/2007   PERIMENOPAUSAL SYNDROME 12/10/2008     Past Surgical History:  Procedure Laterality Date   COLONOSCOPY     CYSTOSCOPY     ?2000   FRACTURE SURGERY  2000    finger-pins placed. "hard to wake up from anesthesia" per pt   POLYPECTOMY     TONSILLECTOMY      Family History  Problem Relation Age of Onset   Cancer Mother    Cancer Father    Colon cancer Father 52    Social History   Tobacco Use   Smoking status: Never   Smokeless tobacco: Never  Vaping Use   Vaping Use: Never used  Substance Use Topics   Alcohol use: Not Currently   Drug use: No    ROS   Objective:   Vitals: BP 105/70 (BP Location: Right Arm)   Pulse 64   Temp 97.9 F (36.6 C) (Oral)   Resp 20   SpO2 95%   Physical Exam Constitutional:      General: She is not in acute distress.    Appearance: Normal appearance. She is well-developed and normal weight. She is not ill-appearing, toxic-appearing or diaphoretic.  HENT:     Head: Normocephalic and atraumatic.     Right Ear: Tympanic membrane, ear canal and external ear normal. No drainage or tenderness. No middle ear effusion. There is no impacted cerumen. Tympanic membrane is not erythematous or bulging.     Left Ear: Tympanic membrane, ear canal and external ear normal. No drainage or tenderness.  No middle ear effusion. There is no impacted cerumen. Tympanic membrane is not erythematous  or bulging.     Nose: Nose normal. No congestion or rhinorrhea.     Mouth/Throat:     Mouth: Mucous membranes are moist. No oral lesions.     Pharynx: No pharyngeal swelling, oropharyngeal exudate, posterior oropharyngeal erythema or uvula swelling.     Tonsils: No tonsillar exudate or tonsillar abscesses.     Comments: Streaks of postnasal drainage overlying pharynx. Eyes:     General: No scleral icterus.       Right eye: No discharge.        Left eye: No discharge.     Extraocular Movements: Extraocular movements intact.     Right eye: Normal extraocular motion.     Left eye: Normal extraocular motion.     Conjunctiva/sclera: Conjunctivae normal.  Cardiovascular:     Rate and Rhythm: Normal rate and regular rhythm.      Heart sounds: Normal heart sounds. No murmur heard.    No friction rub. No gallop.  Pulmonary:     Effort: Pulmonary effort is normal. No respiratory distress.     Breath sounds: No stridor. No wheezing, rhonchi or rales.  Chest:     Chest wall: No tenderness.  Musculoskeletal:     Cervical back: Normal range of motion and neck supple.  Lymphadenopathy:     Cervical: No cervical adenopathy.  Skin:    General: Skin is warm and dry.  Neurological:     General: No focal deficit present.     Mental Status: She is alert and oriented to person, place, and time.  Psychiatric:        Mood and Affect: Mood normal.        Behavior: Behavior normal.    Results for orders placed or performed during the hospital encounter of 04/11/22 (from the past 24 hour(s))  POCT urinalysis dipstick     Status: Abnormal   Collection Time: 04/11/22  9:20 AM  Result Value Ref Range   Color, UA yellow yellow   Clarity, UA clear clear   Glucose, UA =100 (A) negative mg/dL   Bilirubin, UA moderate (A) negative   Ketones, POC UA small (15) (A) negative mg/dL   Spec Grav, UA >=1.030 (A) 1.010 - 1.025   Blood, UA trace-intact (A) negative   pH, UA 5.5 5.0 - 8.0   Protein Ur, POC =100 (A) negative mg/dL   Urobilinogen, UA 1.0 0.2 or 1.0 E.U./dL   Nitrite, UA Negative Negative   Leukocytes, UA Negative Negative     Assessment and Plan :   PDMP not reviewed this encounter.  1. Viral respiratory illness   2. Dehydration     Declined COVID testing.  Emphasized need to hydrate much better more consistently. Deferred imaging given clear cardiopulmonary exam, hemodynamically stable vital signs. Suspect viral URI, viral syndrome. Physical exam findings reassuring and vital signs stable for discharge. Advised supportive care, offered symptomatic relief. Counseled patient on potential for adverse effects with medications prescribed/recommended today, ER and return-to-clinic precautions discussed, patient  verbalized understanding.     Jaynee Eagles, PA-C 04/11/22 0930

## 2022-04-11 NOTE — ED Triage Notes (Signed)
Prior to triage, was called to lobby that pt passed out-arrived to find pt standing in BR with granddaughter-pt with BM-pt states she thinks she "just got hot"-pt did not have LOC or fall-husband and granddaughter state pt lowered self to the ground-at triage pt with c/o cough, fatigue started 12/21-husband answering some of questions-states pt "has been out of it" since illness-pt A/O at present-was brought to tx area via w/c

## 2022-04-23 ENCOUNTER — Encounter: Payer: Self-pay | Admitting: Family

## 2022-04-23 ENCOUNTER — Ambulatory Visit (INDEPENDENT_AMBULATORY_CARE_PROVIDER_SITE_OTHER): Payer: Medicare Other | Admitting: Family

## 2022-04-23 VITALS — BP 122/72 | HR 70 | Temp 98.6°F | Ht 65.0 in | Wt 145.2 lb

## 2022-04-23 DIAGNOSIS — E860 Dehydration: Secondary | ICD-10-CM

## 2022-04-23 DIAGNOSIS — R5383 Other fatigue: Secondary | ICD-10-CM

## 2022-04-23 LAB — COMPREHENSIVE METABOLIC PANEL
ALT: 39 U/L — ABNORMAL HIGH (ref 0–35)
AST: 33 U/L (ref 0–37)
Albumin: 4.3 g/dL (ref 3.5–5.2)
Alkaline Phosphatase: 71 U/L (ref 39–117)
BUN: 15 mg/dL (ref 6–23)
CO2: 26 mEq/L (ref 19–32)
Calcium: 9.3 mg/dL (ref 8.4–10.5)
Chloride: 106 mEq/L (ref 96–112)
Creatinine, Ser: 0.87 mg/dL (ref 0.40–1.20)
GFR: 67.52 mL/min (ref >60.00)
Glucose, Bld: 81 mg/dL (ref 70–99)
Potassium: 4.2 mEq/L (ref 3.5–5.1)
Sodium: 141 mEq/L (ref 135–145)
Total Bilirubin: 0.6 mg/dL (ref 0.2–1.2)
Total Protein: 6.9 g/dL (ref 6.0–8.3)

## 2022-04-23 LAB — TSH: TSH: 1.46 u[IU]/mL (ref 0.35–5.50)

## 2022-04-23 LAB — CBC WITH DIFFERENTIAL/PLATELET
Basophils Absolute: 0.1 10*3/uL (ref 0.0–0.1)
Basophils Relative: 0.8 % (ref 0.0–3.0)
Eosinophils Absolute: 0.1 10*3/uL (ref 0.0–0.7)
Eosinophils Relative: 0.9 % (ref 0.0–5.0)
HCT: 42.2 % (ref 36.0–46.0)
Hemoglobin: 14.4 g/dL (ref 12.0–15.0)
Lymphocytes Relative: 32.9 % (ref 12.0–46.0)
Lymphs Abs: 2.5 10*3/uL (ref 0.7–4.0)
MCHC: 34.2 g/dL (ref 30.0–36.0)
MCV: 90.3 fl (ref 78.0–100.0)
Monocytes Absolute: 0.7 10*3/uL (ref 0.1–1.0)
Monocytes Relative: 9.3 % (ref 3.0–12.0)
Neutro Abs: 4.2 10*3/uL (ref 1.4–7.7)
Neutrophils Relative %: 56.1 % (ref 43.0–77.0)
Platelets: 403 10*3/uL — ABNORMAL HIGH (ref 150.0–400.0)
RBC: 4.67 Mil/uL (ref 3.87–5.11)
RDW: 12.5 % (ref 11.5–15.5)
WBC: 7.5 10*3/uL (ref 4.0–10.5)

## 2022-04-23 NOTE — Progress Notes (Signed)
Megan Lutz is a 72 y.o. female with the following history as recorded in EpicCare:  Patient Active Problem List   Diagnosis Date Noted   Left sided sciatica 01/21/2021   Preventative health care 10/29/2020   Dizziness 10/07/2020   Cough 10/07/2020   Vitamin D deficiency 05/11/2019   Osteopenia 05/11/2019   History of kidney stones 05/11/2019   Anemia 09/25/2009   PERIMENOPAUSAL SYNDROME 12/10/2008   Hyperlipidemia 12/07/2007   Disorder of bone and cartilage 12/07/2007    Current Outpatient Medications  Medication Sig Dispense Refill   aspirin 81 MG tablet Take 81 mg by mouth daily.       Calcium Carbonate-Vitamin D 600-400 MG-UNIT tablet Take 1 tablet by mouth 2 (two) times daily.     cetirizine (ZYRTEC ALLERGY) 10 MG tablet Take 1 tablet (10 mg total) by mouth daily. 30 tablet 0   Lutein-Zeaxanthin 15-0.7 MG CAPS Take 1 capsule by mouth daily.  0   pravastatin (PRAVACHOL) 40 MG tablet Take 1 tablet by mouth once daily 90 tablet 0   promethazine-dextromethorphan (PROMETHAZINE-DM) 6.25-15 MG/5ML syrup Take 2.5 mLs by mouth 3 (three) times daily as needed for cough. 100 mL 0   pseudoephedrine (SUDAFED) 30 MG tablet Take 1 tablet (30 mg total) by mouth every 8 (eight) hours as needed for congestion. 30 tablet 0   albuterol (VENTOLIN HFA) 108 (90 Base) MCG/ACT inhaler Inhale 2 puffs into the lungs every 6 (six) hours as needed for wheezing or shortness of breath. (Patient not taking: Reported on 04/23/2022) 8 g 0   No current facility-administered medications for this visit.    Allergies: Codeine phosphate  Past Medical History:  Diagnosis Date   Allergy    ANEMIA 09/25/2009   Diverticular disease 2013   Hemorrhoid    HYPERLIPIDEMIA 12/07/2007   Kidney stone    OSTEOPENIA 12/07/2007   PERIMENOPAUSAL SYNDROME 12/10/2008    Past Surgical History:  Procedure Laterality Date   COLONOSCOPY     CYSTOSCOPY     ?2000   FRACTURE SURGERY  2000   finger-pins placed. "hard to wake up  from anesthesia" per pt   POLYPECTOMY     TONSILLECTOMY      Family History  Problem Relation Age of Onset   Cancer Mother    Cancer Father    Colon cancer Father 18    Social History   Tobacco Use   Smoking status: Never   Smokeless tobacco: Never  Substance Use Topics   Alcohol use: Not Currently    Subjective:   Seen at U/C on 12/24 "just not feeling well"- sounds like she may have had a vasovagal reaction while she at the [facility]- did not lose consciousness; Unfortunately nothing is documented in U/C note; Diagnosed with viral URI; was told that she was very dehydrated; notes that she is actually feeling better today than she has in a while; no acute symptoms today; Denies any chest pain, shortness of breath, blurred vision or headache.      Objective:  Vitals:   04/23/22 1310  BP: 122/72  Pulse: 70  Temp: 98.6 F (37 C)  TempSrc: Oral  SpO2: 99%  Weight: 145 lb 3.2 oz (65.9 kg)  Height: '5\' 5"'$  (1.651 m)    General: Well developed, well nourished, in no acute distress  Skin : Warm and dry.  Head: Normocephalic and atraumatic  Eyes: Sclera and conjunctiva clear; pupils round and reactive to light; extraocular movements intact  Ears: External normal; canals clear;  tympanic membranes normal  Oropharynx: Pink, supple. No suspicious lesions  Neck: Supple without thyromegaly, adenopathy  Lungs: Respirations unlabored; clear to auscultation bilaterally without wheeze, rales, rhonchi  CVS exam: normal rate and regular rhythm.  Neurologic: Alert and oriented; speech intact; face symmetrical; moves all extremities well; CNII-XII intact without focal deficit   Assessment:  1. Other fatigue   2. Dehydration     Plan:  Suspect vasovagal reaction at recent [facility] visit due to illness and dehydration; has been feeling much better in the past 24 hours; physical exam today is reassuring; check CBC, CMP, TSH today; she is overdue to see her PCP- encouraged to schedule for  CPE;  Return in about 2 weeks (around 05/07/2022) for with [name], re-check, CPE.  Orders Placed This Encounter  Procedures   CBC with Differential/Platelet   Comp Met (CMET)   TSH    Requested Prescriptions    No prescriptions requested or ordered in this encounter

## 2022-05-07 ENCOUNTER — Ambulatory Visit (INDEPENDENT_AMBULATORY_CARE_PROVIDER_SITE_OTHER): Payer: Medicare Other | Admitting: Family

## 2022-05-07 VITALS — BP 113/85 | HR 87 | Temp 97.6°F | Resp 97 | Wt 146.0 lb

## 2022-05-07 DIAGNOSIS — E785 Hyperlipidemia, unspecified: Secondary | ICD-10-CM | POA: Diagnosis not present

## 2022-05-07 DIAGNOSIS — Z638 Other specified problems related to primary support group: Secondary | ICD-10-CM | POA: Insufficient documentation

## 2022-05-07 DIAGNOSIS — Z23 Encounter for immunization: Secondary | ICD-10-CM

## 2022-05-07 DIAGNOSIS — E559 Vitamin D deficiency, unspecified: Secondary | ICD-10-CM | POA: Diagnosis not present

## 2022-05-07 DIAGNOSIS — J069 Acute upper respiratory infection, unspecified: Secondary | ICD-10-CM | POA: Diagnosis not present

## 2022-05-07 DIAGNOSIS — Z1211 Encounter for screening for malignant neoplasm of colon: Secondary | ICD-10-CM

## 2022-05-07 NOTE — Assessment & Plan Note (Signed)
New.  I encouraged her to look into establishing with a counselor.  Also, I encouraged her to find something she can do to relieve stress each day such as walking etc.

## 2022-05-07 NOTE — Assessment & Plan Note (Signed)
This continues to improve.  Pt is advised to let me know if new symptoms occur or if symptoms do not continue to improve.

## 2022-05-07 NOTE — Addendum Note (Signed)
Addended by: Jiles Prows on: 05/07/2022 03:43 PM   Modules accepted: Orders

## 2022-05-07 NOTE — Progress Notes (Addendum)
Subjective:   By signing my name below, I, [name], attest that this documentation has been prepared under the direction and in the presence of [name], NP. 05/07/2022   Patient ID: Megan Lutz, female    DOB: Oct 31, 1950, 72 y.o.   MRN: [id number]  Chief Complaint  Patient presents with   Follow-up    Here for follow up after viral illness, still having a cough.     HPI Patient is in today for a follow up visit.   Cough: She seen an urgent care on 04/11/2022 for viral respiratory illness and dehydration. She tested positive for Covid-19 3 weeks ago. She continues having a cough and nasal drainage since then. She notes her cough and nasal drainage has improved since then.   Stress: She reports having increased stress from her children. Her stress will improve if her daughter moves out and her son stops monitoring her memory.   Cholesterol: She continues taking 40 mg pravastatin and reports no new issues while taking it.  Lab Results  Component Value Date   CHOL 219 (H) 10/29/2020   HDL 52.80 10/29/2020   LDLCALC 113 (H) 06/06/2019   LDLDIRECT 132.0 10/29/2020   TRIG 202.0 (H) 10/29/2020   CHOLHDL 4 10/29/2020   Colonoscopy: She is due for a colonoscopy and is interested in setting up an appointment.   Immunizations: She does not have the latest Covid-19 booster vaccine. She does not have the flu vaccine this year and is interested in receiving it.    Past Medical History:  Diagnosis Date   Allergy    ANEMIA 09/25/2009   Diverticular disease 2013   Hemorrhoid    HYPERLIPIDEMIA 12/07/2007   Kidney stone    OSTEOPENIA 12/07/2007   PERIMENOPAUSAL SYNDROME 12/10/2008    Past Surgical History:  Procedure Laterality Date   COLONOSCOPY     CYSTOSCOPY     ?2000   FRACTURE SURGERY  2000   finger-pins placed. "hard to wake up from anesthesia" per pt   POLYPECTOMY     TONSILLECTOMY      Family History  Problem Relation Age of Onset   Cancer Mother     Cancer Father    Colon cancer Father 87    Social History   Socioeconomic History   Marital status: Divorced    Spouse name: Not on file   Number of children: Not on file   Years of education: Not on file   Highest education level: Not on file  Occupational History   Not on file  Tobacco Use   Smoking status: Never   Smokeless tobacco: Never  Vaping Use   Vaping Use: Never used  Substance and Sexual Activity   Alcohol use: Not Currently   Drug use: No   Sexual activity: Not Currently  Other Topics Concern   Not on file  Social History Narrative   retired   Son and daughter- both live locally- has a 52 yr old granddaughter   Divorced- 1998, 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[place])   Social Determinants of Health   Financial Resource Strain: [place]  (05/25/2021)   Overall Financial Resource Strain (CARDIA)    Difficulty of Paying Living Expenses: Not hard at [place]: No [place] (05/25/2021)   Hunger Vital Sign    Worried About [place] in the Last Year: Never true    [place] in the Last Year: Never true  Transportation Needs: No Transportation  Needs (05/25/2021)   PRAPARE - Hydrologist (Medical): No    Lack of Transportation (Non-Medical): No  Physical Activity: Insufficiently Active (05/25/2021)   Exercise Vital Sign    Days of Exercise per Week: 2 days    Minutes of Exercise per Session: 40 min  Stress: No Stress Concern Present (05/25/2021)   [place]    Feeling of Stress : Only a little  Social Connections: Moderately Isolated (05/25/2021)   Social Connection and Isolation Panel [NHANES]    Frequency of Communication with Friends and Family: More than three times a week    Frequency of Social Gatherings with Friends and Family: More than three times a week    Attends Religious Services: More than 4 times per year    Active Member of [employer] or  Organizations: No    Attends Archivist Meetings: Never    Marital Status: Divorced  Human resources officer Violence: Not At Risk (05/25/2021)   Humiliation, Afraid, Rape, and Kick questionnaire    Fear of Current or Ex-Partner: No    Emotionally Abused: No    Physically Abused: No    Sexually Abused: No    Outpatient Medications Prior to Visit  Medication Sig Dispense Refill   albuterol (VENTOLIN HFA) 108 (90 Base) MCG/ACT inhaler Inhale 2 puffs into the lungs every 6 (six) hours as needed for wheezing or shortness of breath. 8 g 0   aspirin 81 MG tablet Take 81 mg by mouth daily.       Calcium Carbonate-Vitamin D 600-400 MG-UNIT tablet Take 1 tablet by mouth 2 (two) times daily.     cetirizine (ZYRTEC ALLERGY) 10 MG tablet Take 1 tablet (10 mg total) by mouth daily. 30 tablet 0   Lutein-Zeaxanthin 15-0.7 MG CAPS Take 1 capsule by mouth daily.  0   pravastatin (PRAVACHOL) 40 MG tablet Take 1 tablet by mouth once daily 90 tablet 0   promethazine-dextromethorphan (PROMETHAZINE-DM) 6.25-15 MG/5ML syrup Take 2.5 mLs by mouth 3 (three) times daily as needed for cough. 100 mL 0   pseudoephedrine (SUDAFED) 30 MG tablet Take 1 tablet (30 mg total) by mouth every 8 (eight) hours as needed for congestion. 30 tablet 0   No facility-administered medications prior to visit.    Allergies  Allergen Reactions   Codeine Phosphate Nausea And Vomiting    Review of Systems  HENT:         (+)nasal drainage  Respiratory:  Positive for cough.        Objective:    Physical Exam Constitutional:      General: She is not in acute distress.    Appearance: Normal appearance. She is not ill-appearing.  HENT:     Head: Normocephalic and atraumatic.     Right Ear: External ear normal.     Left Ear: External ear normal.  Eyes:     Extraocular Movements: Extraocular movements intact.     Pupils: Pupils are equal, round, and reactive to light.  Cardiovascular:     Rate and Rhythm: Normal rate and  regular rhythm.     Heart sounds: Normal heart sounds. No murmur heard.    No gallop.  Pulmonary:     Effort: Pulmonary effort is normal. No respiratory distress.     Breath sounds: Normal breath sounds. No wheezing or rales.  Skin:    General: Skin is warm and dry.  Neurological:     Mental  Status: She is alert and oriented to person, place, and time.  Psychiatric:        Judgment: Judgment normal.     BP 113/85 (BP Location: Right Arm, Patient Position: Sitting, Cuff Size: Small)   Pulse 87   Temp 97.6 F (36.4 C) (Oral)   Resp (!) 97   Wt 146 lb (66.2 kg)   HC 16" (40.6 cm)   BMI 24.30 kg/m  Wt Readings from Last 3 Encounters:  05/07/22 146 lb (66.2 kg)  04/23/22 145 lb 3.2 oz (65.9 kg)  01/13/22 149 lb 6.4 oz (67.8 kg)       Assessment & Plan:  Hyperlipidemia, unspecified hyperlipidemia type Assessment & Plan: Lab Results  Component Value Date   CHOL 219 (H) 10/29/2020   HDL 52.80 10/29/2020   LDLCALC 113 (H) 06/06/2019   LDLDIRECT 132.0 10/29/2020   TRIG 202.0 (H) 10/29/2020   CHOLHDL 4 10/29/2020   Continues pravastatin.    Orders: -     Lipid panel  Vitamin D deficiency -     VITAMIN D 25 Hydroxy (Vit-D Deficiency, Fractures)  Screening for colon cancer -     Ambulatory referral to Gastroenterology  Stress due to family tension Assessment & Plan: New.  I encouraged her to look into establishing with a counselor.  Also, I encouraged her to find something she can do to relieve stress each day such as walking etc.    Viral URI with cough Assessment & Plan: This continues to improve.  Pt is advised to let me know if new symptoms occur or if symptoms do not continue to improve.      I, Nance Pear, NP, personally preformed the services described in this documentation.  All medical record entries made by the scribe were at my direction and in my presence.  I have reviewed the chart and discharge instructions (if applicable) and agree that  the record reflects my personal performance and is accurate and complete. 05/07/2022   I,[name],acting as a scribe for Nance Pear, NP.,have documented all relevant documentation on the behalf of Nance Pear, NP,as directed by  Nance Pear, NP while in the presence of Nance Pear, NP.   Nance Pear, NP

## 2022-05-07 NOTE — Assessment & Plan Note (Signed)
Lab Results  Component Value Date   CHOL 219 (H) 10/29/2020   HDL 52.80 10/29/2020   LDLCALC 113 (H) 06/06/2019   LDLDIRECT 132.0 10/29/2020   TRIG 202.0 (H) 10/29/2020   CHOLHDL 4 10/29/2020   Continues pravastatin.

## 2022-05-08 LAB — LIPID PANEL
Cholesterol: 271 mg/dL — ABNORMAL HIGH (ref <200)
HDL: 57 mg/dL (ref >=50)
LDL Cholesterol (Calc): 167 mg/dL (calc) — ABNORMAL HIGH
Non-HDL Cholesterol (Calc): 214 mg/dL (calc) — ABNORMAL HIGH (ref <130)
Total CHOL/HDL Ratio: 4.8 (calc) (ref <5.0)
Triglycerides: 305 mg/dL — ABNORMAL HIGH (ref <150)

## 2022-05-08 LAB — VITAMIN D 25 HYDROXY (VIT D DEFICIENCY, FRACTURES): Vit D, 25-Hydroxy: 14 ng/mL — ABNORMAL LOW (ref 30–100)

## 2022-05-09 ENCOUNTER — Telehealth: Payer: Self-pay | Admitting: Family

## 2022-05-09 DIAGNOSIS — E559 Vitamin D deficiency, unspecified: Secondary | ICD-10-CM

## 2022-05-09 DIAGNOSIS — E785 Hyperlipidemia, unspecified: Secondary | ICD-10-CM

## 2022-05-09 MED ORDER — VITAMIN D (ERGOCALCIFEROL) 1.25 MG (50000 UNIT) PO CAPS: 50000.0000 [IU] | ORAL_CAPSULE | ORAL | 0 refills | Status: DC

## 2022-05-09 MED ORDER — ROSUVASTATIN CALCIUM 10 MG PO TABS: 10.0000 mg | ORAL_TABLET | Freq: Every day | ORAL | 1 refills | Status: DC

## 2022-05-09 NOTE — Telephone Encounter (Signed)
Vitamin D level is low.  Advise patient to begin vit D 50000 units once weekly for 12 weeks, then repeat vit D level (dx Vit D deficiency).   Rx sent to [facility].   Cholesterol has risen significantly.  I would recommend that we change her  from pravastatin to crestor '10mg'$  once daily and repeat lipids in 3 months.

## 2022-05-10 NOTE — Telephone Encounter (Signed)
Called but no answer, lvm for patient to call back 

## 2022-05-11 NOTE — Telephone Encounter (Signed)
Called again but no answer. Left another voice mail

## 2022-05-11 NOTE — Telephone Encounter (Signed)
Patient said her medication had changed so she wanted to know why. Advised that CMA tried to reach her to discuss but she is currently unavailable. Please call patient back to advise.

## 2022-05-12 NOTE — Telephone Encounter (Signed)
Lvm again today

## 2022-05-13 NOTE — Telephone Encounter (Signed)
Patient notified of results and new vit d rx and also change in cholesterol medication. She verbalized understanding.

## 2022-05-18 ENCOUNTER — Ambulatory Visit
Admission: RE | Admit: 2022-05-18 | Discharge: 2022-05-18 | Disposition: A | Discharge status: Home / Self Care | Payer: Medicare Other | Source: Ambulatory Visit | Attending: Family | Admitting: Family

## 2022-05-18 ENCOUNTER — Encounter: Payer: Self-pay | Admitting: Family

## 2022-05-18 DIAGNOSIS — Z1231 Encounter for screening mammogram for malignant neoplasm of breast: Secondary | ICD-10-CM | POA: Diagnosis not present

## 2022-05-27 ENCOUNTER — Ambulatory Visit (INDEPENDENT_AMBULATORY_CARE_PROVIDER_SITE_OTHER): Payer: Medicare Other | Admitting: *Deleted

## 2022-05-27 VITALS — BP 121/74 | HR 68 | Ht 65.0 in | Wt 148.0 lb

## 2022-05-27 DIAGNOSIS — Z0000 Encounter for general adult medical examination without abnormal findings: Secondary | ICD-10-CM

## 2022-05-27 NOTE — Progress Notes (Signed)
Subjective:   Megan Lutz is a 72 y.o. female who presents for Medicare Annual (Subsequent) preventive examination.  Review of Systems    Defer to PCP Cardiac Risk Factors include: advanced age (>84mn, >>19women);dyslipidemia     Objective:    Today's Vitals   05/27/22 0902  BP: 121/74  Pulse: 68  Weight: 148 lb (67.1 kg)  Height: '5\' 5"'$  (1.651 m)   Body mass index is 24.63 kg/m.     05/27/2022    9:04 AM 05/25/2021    9:10 AM  Advanced Directives  Does Patient Have a Medical Advance Directive? No No  Would patient like information on creating a medical advance directive? No - Patient declined Yes (MAU/Ambulatory/Procedural Areas - Information given)    Current Medications (verified) Outpatient Encounter Medications as of 05/27/2022  Medication Sig   aspirin 81 MG tablet Take 81 mg by mouth daily.     Calcium Carbonate-Vitamin D 600-400 MG-UNIT tablet Take 1 tablet by mouth 2 (two) times daily.   Lutein-Zeaxanthin 15-0.7 MG CAPS Take 1 capsule by mouth daily.   rosuvastatin (CRESTOR) 10 MG tablet Take 1 tablet (10 mg total) by mouth daily.   Vitamin D, Ergocalciferol, (DRISDOL) 1.25 MG (50000 UNIT) CAPS capsule Take 1 capsule (50,000 Units total) by mouth every 7 (seven) days.   [DISCONTINUED] albuterol (VENTOLIN HFA) 108 (90 Base) MCG/ACT inhaler Inhale 2 puffs into the lungs every 6 (six) hours as needed for wheezing or shortness of breath.   [DISCONTINUED] cetirizine (ZYRTEC ALLERGY) 10 MG tablet Take 1 tablet (10 mg total) by mouth daily.   [DISCONTINUED] promethazine-dextromethorphan (PROMETHAZINE-DM) 6.25-15 MG/5ML syrup Take 2.5 mLs by mouth 3 (three) times daily as needed for cough.   [DISCONTINUED] pseudoephedrine (SUDAFED) 30 MG tablet Take 1 tablet (30 mg total) by mouth every 8 (eight) hours as needed for congestion.   No facility-administered encounter medications on file as of 05/27/2022.    Allergies (verified) Codeine phosphate   History: Past  Medical History:  Diagnosis Date   Allergy    ANEMIA 09/25/2009   Diverticular disease 2013   Hemorrhoid    HYPERLIPIDEMIA 12/07/2007   Kidney stone    OSTEOPENIA 12/07/2007   PERIMENOPAUSAL SYNDROME 12/10/2008   Past Surgical History:  Procedure Laterality Date   COLONOSCOPY     CYSTOSCOPY     ?2000   FRACTURE SURGERY  2000   finger-pins placed. "hard to wake up from anesthesia" per pt   POLYPECTOMY     TONSILLECTOMY     Family History  Problem Relation Age of Onset   Cancer Mother    Cancer Father    Colon cancer Father 716  Social History   Socioeconomic History   Marital status: Divorced    Spouse name: Not on file   Number of children: Not on file   Years of education: Not on file   Highest education level: Not on file  Occupational History   Not on file  Tobacco Use   Smoking status: Never   Smokeless tobacco: Never  Vaping Use   Vaping Use: Never used  Substance and Sexual Activity   Alcohol use: Not Currently   Drug use: No   Sexual activity: Not Currently  Other Topics Concern   Not on file  Social History Narrative   retired   Son and daughter- both live locally- has a 142yr old granddaughter   Divorced- 1998, 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[place]   Social Determinants of Health  Financial Resource Strain: Low Risk  (05/25/2021)   Overall Financial Resource Strain (CARDIA)    Difficulty of Paying Living Expenses: Not hard at all  Food Insecurity: No Food Insecurity (05/27/2022)   Hunger Vital Sign    Worried About Running Out of Food in the Last Year: Never true    Ran Out of Food in the Last Year: Never true  Transportation Needs: No Transportation Needs (05/27/2022)   PRAPARE - Hydrologist (Medical): No    Lack of Transportation (Non-Medical): No  Physical Activity: Insufficiently Active (05/25/2021)   Exercise Vital Sign    Days of Exercise per Week: 2 days    Minutes of Exercise per Session: 40 min  Stress: No Stress Concern  Present (05/25/2021)   [place]    Feeling of Stress : Only a little  Social Connections: Moderately Isolated (05/25/2021)   Social Connection and Isolation Panel [NHANES]    Frequency of Communication with Friends and Family: More than three times a week    Frequency of Social Gatherings with Friends and Family: More than three times a week    Attends Religious Services: More than 4 times per year    Active Member of [employer] or Organizations: No    Attends Music therapist: Never    Marital Status: Divorced    Tobacco Counseling Counseling given: Not Answered   Clinical Intake:  Pre-visit preparation completed: Yes  Pain : No/denies pain   Diabetes: No  How often do you need to have someone help you when you read instructions, pamphlets, or other written materials from your doctor or pharmacy?: 1 - Never  Activities of Daily Living    05/27/2022    9:15 AM  In your present state of health, do you have any difficulty performing the following activities:  Hearing? 0  Vision? 0  Difficulty concentrating or making decisions? 0  Walking or climbing stairs? 0  Dressing or bathing? 0  Doing errands, shopping? 0  Preparing Food and eating ? N  Using the Toilet? N  In the past six months, have you accidently leaked urine? N  Do you have problems with loss of bowel control? N  Managing your Medications? N  Managing your Finances? N  Housekeeping or managing your Housekeeping? N    Patient Care Team: [name], NP as PCP - General (Internal Medicine)  Indicate any recent Medical Services you may have received from other than [facility] providers in the past year (date may be approximate).     Assessment:   This is a routine wellness examination for [name].  Hearing/Vision screen No results found.  Dietary issues and exercise activities discussed: Current Exercise Habits: Home exercise  routine, Type of exercise: walking, Time (Minutes): 60, Frequency (Times/Week): 2, Weekly Exercise (Minutes/Week): 120, Intensity: Mild, Exercise limited by: None identified   Goals Addressed   None    Depression Screen    05/27/2022    9:14 AM 04/23/2022    1:13 PM 05/25/2021    9:22 AM 10/07/2020   12:48 PM 06/11/2019   12:27 PM 02/06/2016    1:18 PM 12/30/2014    3:05 PM  PHQ 2/9 Scores  PHQ - 2 Score 0 0 1 0 0 0 0    Fall Risk    05/27/2022    9:05 AM 04/23/2022    1:12 PM 05/25/2021    9:16 AM 10/07/2020   12:48 PM  06/11/2019   12:27 PM  [place] in the past year? 0 1 0 1 0  Number falls in past yr: 0 0 0 0 0  Injury with Fall? 0 0 0 1 0  Risk for fall due to : No Fall Risks History of fall(s)     Follow up Falls evaluation completed Falls evaluation completed Falls prevention discussed      FALL RISK PREVENTION PERTAINING TO THE HOME:  Any stairs in or around the home? Yes  If so, are there any without handrails? No  Home free of loose throw rugs in walkways, pet beds, electrical cords, etc? Yes  Adequate lighting in your home to reduce risk of falls? Yes   ASSISTIVE DEVICES UTILIZED TO PREVENT FALLS:  Life alert? No  Use of a cane, walker or w/c? No  Grab bars in the bathroom? No  Shower chair or bench in shower? No  Elevated toilet seat or a handicapped toilet? No   TIMED UP AND GO:  Was the test performed? Yes .  Length of time to ambulate 10 feet: 7 sec.   Gait steady and fast without use of assistive device  Cognitive Function:    10/15/2019    8:23 AM  MMSE - Mini Mental State Exam  Orientation to time 5  Orientation to Place 5  Registration 3  Attention/ Calculation 5  Recall 3  Language- name 2 objects 2  Language- repeat 1  Language- follow 3 step command 3  Language- read & follow direction 1  Write a sentence 1  Copy design 1  Total score 30        05/27/2022    9:19 AM  6CIT Screen  What Year? 0 points  What month? 0 points   What time? 0 points  Count back from 20 2 points  Months in reverse 0 points  Repeat phrase 2 points  Total Score 4 points    Immunizations Immunization History  Administered Date(s) Administered   Fluad Quad(high Dose 65+) 01/18/2019, 05/07/2022   Influenza Split 04/06/2011, 01/14/2012   Influenza Whole 04/02/2010   Influenza, High Dose Seasonal PF 02/07/2017, 02/08/2018, 01/30/2019   Influenza,inj,Quad PF,6+ Mos 12/30/2014, 02/06/2016   Influenza-Unspecified 03/19/2021   PFIZER(Purple Top)SARS-COV-2 Vaccination 07/09/2019, 08/01/2019   Pneumococcal Conjugate-13 02/07/2017   Pneumococcal Polysaccharide-23 06/11/2019   Tdap 12/17/2013   Zoster Recombinat (Shingrix) 06/11/2019, 10/15/2019    TDAP status: Up to date  Flu Vaccine status: Up to date  Pneumococcal vaccine status: Up to date  Covid-19 vaccine status: Information provided on how to obtain vaccines.   Qualifies for Shingles Vaccine? Yes   Zostavax completed No   Shingrix Completed?: Yes  Screening Tests Health Maintenance  Topic Date Due   Hepatitis C Screening  Never done   COVID-19 Vaccine (3 - 2023-24 season) 12/18/2021   COLONOSCOPY (Pts 45-38yr Insurance coverage will need to be confirmed)  03/25/2022   Medicare Annual Wellness (AWV)  05/25/2022   MAMMOGRAM  05/19/2023   DTaP/Tdap/Td (2 - Td or Tdap) 12/18/2023   Pneumonia Vaccine 72 Years old  Completed   INFLUENZA VACCINE  Completed   DEXA SCAN  Completed   Zoster Vaccines- Shingrix  Completed   HPV VACCINES  Aged Out    Health Maintenance  Health Maintenance Due  Topic Date Due   Hepatitis C Screening  Never done   COVID-19 Vaccine (3 - 2023-24 season) 12/18/2021   COLONOSCOPY (Pts 45-427yrInsurance coverage will need to  be confirmed)  03/25/2022   Medicare Annual Wellness (AWV)  05/25/2022    Colorectal cancer screening: Type of screening: Colonoscopy. Completed 03/25/17. Repeat every 5 years  Mammogram status: Completed 05/18/22.  Repeat every year  Bone Density status: scheduled for 10/04/22  Lung Cancer Screening: (Low Dose CT Chest recommended if Age 75-80 years, 30 pack-year currently smoking OR have quit w/in 15years.) does not qualify.   Additional Screening:  Hepatitis C Screening: does qualify; Completed N/a  Vision Screening: Recommended annual ophthalmology exams for early detection of glaucoma and other disorders of the eye. Is the patient up to date with their annual eye exam?  Yes  Who is the provider or what is the name of the office in which the patient attends annual eye exams? Dr. Delman Cheadle If pt is not established with a provider, would they like to be referred to a provider to establish care? No .   Dental Screening: Recommended annual dental exams for proper oral hygiene  Community Resource Referral / Chronic Care Management: CRR required this visit?  No   CCM required this visit?  No      Plan:     I have personally reviewed and noted the following in the patient's chart:   Medical and social history Use of alcohol, tobacco or illicit drugs  Current medications and supplements including opioid prescriptions. Patient is not currently taking opioid prescriptions. Functional ability and status Nutritional status Physical activity Advanced directives List of other physicians Hospitalizations, surgeries, and ER visits in previous 12 months Vitals Screenings to include cognitive, depression, and falls Referrals and appointments  In addition, I have reviewed and discussed with patient certain preventive protocols, quality metrics, and best practice recommendations. A written personalized care plan for preventive services as well as general preventive health recommendations were provided to patient.     Beatris Ship, [place]   05/27/2022   Nurse Notes: None

## 2022-05-27 NOTE — Patient Instructions (Addendum)
[name] , Thank you for taking time to come for your Medicare Wellness Visit. I appreciate your ongoing commitment to your health goals. Please review the following plan we discussed and let me know if I can assist you in the future.   These are the goals we discussed:  Goals      Patient Stated     Drink more water & increase activity        This is a list of the screening recommended for you and due dates:  Health Maintenance  Topic Date Due   Hepatitis C Screening: USPSTF Recommendation to screen - Ages 52-79 yo.  Never done   COVID-19 Vaccine (3 - 2023-24 season) 12/18/2021   Colon Cancer Screening  03/25/2022   Mammogram  05/19/2023   Medicare Annual Wellness Visit  05/28/2023   DTaP/Tdap/Td vaccine (2 - Td or Tdap) 12/18/2023   Pneumonia Vaccine  Completed   Flu Shot  Completed   DEXA scan (bone density measurement)  Completed   Zoster (Shingles) Vaccine  Completed   HPV Vaccine  Aged Out     Next appointment: Follow up in one year for your annual wellness visit. May 31, 2023 9:40am   Preventive Care 26 Years and Older, Female Preventive care refers to lifestyle choices and visits with your health care provider that can promote health and wellness. What does preventive care include? A yearly physical exam. This is also called an annual well check. Dental exams once or twice a year. Routine eye exams. Ask your health care provider how often you should have your eyes checked. Personal lifestyle choices, including: Daily care of your teeth and gums. Regular physical activity. Eating a healthy diet. Avoiding tobacco and drug use. Limiting alcohol use. Practicing safe sex. Taking low-dose aspirin every day. Taking vitamin and mineral supplements as recommended by your health care provider. What happens during an annual well check? The services and screenings done by your health care provider during your annual well check will depend on your age, overall health,  lifestyle risk factors, and family history of disease. Counseling  Your health care provider may ask you questions about your: Alcohol use. Tobacco use. Drug use. Emotional well-being. Home and relationship well-being. Sexual activity. Eating habits. History of falls. Memory and ability to understand (cognition). Work and work Statistician. Reproductive health. Screening  You may have the following tests or measurements: Height, weight, and BMI. Blood pressure. Lipid and cholesterol levels. These may be checked every 5 years, or more frequently if you are over 45 years old. Skin check. Lung cancer screening. You may have this screening every year starting at age 39 if you have a 30-pack-year history of smoking and currently smoke or have quit within the past 15 years. Fecal occult blood test (FOBT) of the stool. You may have this test every year starting at age 28. Flexible sigmoidoscopy or colonoscopy. You may have a sigmoidoscopy every 5 years or a colonoscopy every 10 years starting at age 62. Hepatitis C blood test. Hepatitis B blood test. Sexually transmitted disease (STD) testing. Diabetes screening. This is done by checking your blood sugar (glucose) after you have not eaten for a while (fasting). You may have this done every 1-3 years. Bone density scan. This is done to screen for osteoporosis. You may have this done starting at age 20. Mammogram. This may be done every 1-2 years. Talk to your health care provider about how often you should have regular mammograms. Talk with your  health care provider about your test results, treatment options, and if necessary, the need for more tests. Vaccines  Your health care provider may recommend certain vaccines, such as: Influenza vaccine. This is recommended every year. Tetanus, diphtheria, and acellular pertussis (Tdap, Td) vaccine. You may need a Td booster every 10 years. Zoster vaccine. You may need this after age  19. Pneumococcal 13-valent conjugate (PCV13) vaccine. One dose is recommended after age 2. Pneumococcal polysaccharide (PPSV23) vaccine. One dose is recommended after age 50. Talk to your health care provider about which screenings and vaccines you need and how often you need them. This information is not intended to replace advice given to you by your health care provider. Make sure you discuss any questions you have with your health care provider. Document Released: 05/02/2015 Document Revised: 12/24/2015 Document Reviewed: 02/04/2015 Elsevier Interactive Patient Education  2017 [place] Prevention in the Home Falls can cause injuries. They can happen to people of all ages. There are many things you can do to make your home safe and to help prevent falls. What can I do on the outside of my home? Regularly fix the edges of walkways and driveways and fix any cracks. Remove anything that might make you trip as you walk through a door, such as a raised step or threshold. Trim any bushes or trees on the path to your home. Use bright outdoor lighting. Clear any walking paths of anything that might make someone trip, such as rocks or tools. Regularly check to see if handrails are loose or broken. Make sure that both sides of any steps have handrails. Any raised decks and porches should have guardrails on the edges. Have any leaves, snow, or ice cleared regularly. Use sand or salt on walking paths during winter. Clean up any spills in your garage right away. This includes oil or grease spills. What can I do in the bathroom? Use night lights. Install grab bars by the toilet and in the tub and shower. Do not use towel bars as grab bars. Use non-skid mats or decals in the tub or shower. If you need to sit down in the shower, use a plastic, non-slip stool. Keep the floor dry. Clean up any water that spills on the floor as soon as it happens. Remove soap buildup in the tub or shower  regularly. Attach bath mats securely with double-sided non-slip rug tape. Do not have throw rugs and other things on the floor that can make you trip. What can I do in the bedroom? Use night lights. Make sure that you have a light by your bed that is easy to reach. Do not use any sheets or blankets that are too big for your bed. They should not hang down onto the floor. Have a firm chair that has side arms. You can use this for support while you get dressed. Do not have throw rugs and other things on the floor that can make you trip. What can I do in the kitchen? Clean up any spills right away. Avoid walking on wet floors. Keep items that you use a lot in easy-to-reach places. If you need to reach something above you, use a strong step stool that has a grab bar. Keep electrical cords out of the way. Do not use floor polish or wax that makes floors slippery. If you must use wax, use non-skid floor wax. Do not have throw rugs and other things on the floor that can make you trip. What  can I do with my stairs? Do not leave any items on the stairs. Make sure that there are handrails on both sides of the stairs and use them. Fix handrails that are broken or loose. Make sure that handrails are as long as the stairways. Check any carpeting to make sure that it is firmly attached to the stairs. Fix any carpet that is loose or worn. Avoid having throw rugs at the top or bottom of the stairs. If you do have throw rugs, attach them to the floor with carpet tape. Make sure that you have a light switch at the top of the stairs and the bottom of the stairs. If you do not have them, ask someone to add them for you. What else can I do to help prevent falls? Wear shoes that: Do not have high heels. Have rubber bottoms. Are comfortable and fit you well. Are closed at the toe. Do not wear sandals. If you use a stepladder: Make sure that it is fully opened. Do not climb a closed stepladder. Make sure that  both sides of the stepladder are locked into place. Ask someone to hold it for you, if possible. Clearly mark and make sure that you can see: Any grab bars or handrails. First and last steps. Where the edge of each step is. Use tools that help you move around (mobility aids) if they are needed. These include: Canes. Walkers. Scooters. Crutches. Turn on the lights when you go into a dark area. Replace any light bulbs as soon as they burn out. Set up your furniture so you have a clear path. Avoid moving your furniture around. If any of your floors are uneven, fix them. If there are any pets around you, be aware of where they are. Review your medicines with your doctor. Some medicines can make you feel dizzy. This can increase your chance of falling. Ask your doctor what other things that you can do to help prevent falls. This information is not intended to replace advice given to you by your health care provider. Make sure you discuss any questions you have with your health care provider. Document Released: 01/30/2009 Document Revised: 09/11/2015 Document Reviewed: 05/10/2014 Elsevier Interactive Patient Education  2017 [employer].

## 2022-06-08 DIAGNOSIS — L812 Freckles: Secondary | ICD-10-CM | POA: Diagnosis not present

## 2022-06-08 DIAGNOSIS — L738 Other specified follicular disorders: Secondary | ICD-10-CM | POA: Diagnosis not present

## 2022-06-08 DIAGNOSIS — L821 Other seborrheic keratosis: Secondary | ICD-10-CM | POA: Diagnosis not present

## 2022-06-08 DIAGNOSIS — D225 Melanocytic nevi of trunk: Secondary | ICD-10-CM | POA: Diagnosis not present

## 2022-06-09 DIAGNOSIS — H524 Presbyopia: Secondary | ICD-10-CM | POA: Diagnosis not present

## 2022-07-20 ENCOUNTER — Encounter: Payer: Self-pay | Admitting: Gastroenterology

## 2022-08-10 ENCOUNTER — Ambulatory Visit (AMBULATORY_SURGERY_CENTER): Payer: Medicare Other | Admitting: *Deleted

## 2022-08-10 ENCOUNTER — Telehealth: Payer: Self-pay | Admitting: *Deleted

## 2022-08-10 VITALS — Ht 65.0 in | Wt 145.0 lb

## 2022-08-10 DIAGNOSIS — Z85038 Personal history of other malignant neoplasm of large intestine: Secondary | ICD-10-CM

## 2022-08-10 DIAGNOSIS — Z800 Family history of malignant neoplasm of digestive organs: Secondary | ICD-10-CM

## 2022-08-10 NOTE — Telephone Encounter (Signed)
Pre visit

## 2022-08-10 NOTE — Progress Notes (Signed)
Pt's name and DOB verified at the beginning of the pre-visit.  Pt denies any difficulty with ambulating.  No egg or soy allergy known to patient  No issues known to pt with past sedation with any surgeries or procedures Pt denies having been intubated Pt has no issues moving head, neck or swallowing No FH of Malignant Hyperthermia Pt is not on diet pills Pt is not on home 02  Pt is not on blood thinners  Pt denies issues with constipation  Pt is not on dialysis Pt denies any upcoming cardiac testing Patient's chart reviewed by Cathlyn Parsons CNRA prior to pre-visit and patient appropriate for the [facility].  Pre-visit completed and red dot placed by patient's name on their procedure day (on provider's schedule).  . Visit by phone Pt states weight is 145 lb Instructed pt why it is important to and  to call if they have any changes in health or new medications. Directed them to the # given and on instructions.   Pt states they will.  Instructions reviewed with pt and pt states understanding. Instructed to review again prior to procedure. Pt states they will.  Instructions sent by mail with coupon and by my chart

## 2022-08-10 NOTE — Telephone Encounter (Signed)
Attempted to reach pt for pre-visit appt. LM with # for pt. Will attempt to reach pt again.

## 2022-08-23 ENCOUNTER — Encounter: Payer: Self-pay | Admitting: Gastroenterology

## 2022-08-23 ENCOUNTER — Telehealth: Payer: Self-pay | Admitting: Gastroenterology

## 2022-08-23 DIAGNOSIS — M79641 Pain in right hand: Secondary | ICD-10-CM | POA: Diagnosis not present

## 2022-08-23 MED ORDER — PLENVU 140 G PO SOLR: 1.0000 | ORAL | 0 refills | Status: DC

## 2022-08-23 NOTE — Telephone Encounter (Signed)
Order sent to pharmacy listed in patient's medical record and also verified when patient called into office; Called and spoke with patient- informed patient that RX has been sent with Plenvu Medicare coupon information listed in the RX itself;  Advised patient to call to the pharmacy prior to leaving house today to make sure RX was available and ready for pick up; Patient verbalized understanding of information/instructions;    Patient advised to call back to the office at [phone] should questions/concerns arise;

## 2022-08-23 NOTE — Telephone Encounter (Signed)
PT is calling to have Plenvu sent to [facility] on [address]. Please advise.

## 2022-08-27 ENCOUNTER — Telehealth: Payer: Self-pay

## 2022-08-27 NOTE — Telephone Encounter (Signed)
PA request received via CMM for Plenvu 140GM solution  PA not submitted at this time due to preferred alternatives available   Key: [id number]

## 2022-08-30 NOTE — Telephone Encounter (Signed)
A RX coupon code was sent in the RX itself for the patient to get a discounted price- the patient was instructed that her insurance would NOT cover this medication - she will need to contact the pharmacy to ensure the pharmacist USED the coupon codes for the discount- also the patient was made aware that whatever the cost was once the coupon was used was her "out of pocket expense";  please contact patient or pharmacy for further questions;  Thank you  Bre

## 2022-09-03 ENCOUNTER — Other Ambulatory Visit: Payer: Self-pay

## 2022-09-03 ENCOUNTER — Telehealth: Payer: Self-pay | Admitting: Gastroenterology

## 2022-09-03 DIAGNOSIS — Z800 Family history of malignant neoplasm of digestive organs: Secondary | ICD-10-CM

## 2022-09-03 DIAGNOSIS — Z85038 Personal history of other malignant neoplasm of large intestine: Secondary | ICD-10-CM

## 2022-09-03 MED ORDER — PLENVU 140 G PO SOLR: 1.0000 | ORAL | 0 refills | Status: DC

## 2022-09-03 NOTE — Telephone Encounter (Signed)
Rx sent to [facility] as requested. Pt understands she needs to take her coupon with her to the pharmacy for her discount.

## 2022-09-03 NOTE — Telephone Encounter (Signed)
Patient called stating her prep medication has not been sent to [facility] pharmacy on [address] for her procedure on 05/22. Requesting a call back when this is done. Please advise, thank you.

## 2022-09-08 ENCOUNTER — Ambulatory Visit (AMBULATORY_SURGERY_CENTER): Payer: Medicare Other | Admitting: Gastroenterology

## 2022-09-08 ENCOUNTER — Encounter: Payer: Self-pay | Admitting: Gastroenterology

## 2022-09-08 VITALS — BP 108/65 | HR 60 | Temp 98.0°F | Resp 13 | Ht 65.0 in | Wt 145.0 lb

## 2022-09-08 DIAGNOSIS — Z86010 Personal history of colonic polyps: Secondary | ICD-10-CM | POA: Diagnosis not present

## 2022-09-08 DIAGNOSIS — Z85038 Personal history of other malignant neoplasm of large intestine: Secondary | ICD-10-CM | POA: Diagnosis not present

## 2022-09-08 DIAGNOSIS — Z09 Encounter for follow-up examination after completed treatment for conditions other than malignant neoplasm: Secondary | ICD-10-CM | POA: Diagnosis not present

## 2022-09-08 DIAGNOSIS — Z800 Family history of malignant neoplasm of digestive organs: Secondary | ICD-10-CM

## 2022-09-08 MED ORDER — SODIUM CHLORIDE 0.9 % IV SOLN: 500.0000 mL | INTRAVENOUS | Status: DC

## 2022-09-08 NOTE — Op Note (Signed)
[facility] Patient Name: Megan Lutz Procedure Date: 09/08/2022 2:06 PM MRN: [id number] Endoscopist: Sherilyn Cooter L. Myrtie Neither , MD, [id number] Age: 72 Referring MD:  Date of Birth: [birth year] Gender: Female Account #: [id number] Procedure:                Colonoscopy Indications:              Screening in patient at increased risk: Colorectal                            cancer in father 45 or older,                           High risk colon cancer surveillance: Personal                            history of colonic polyps (<73mm TA Feb 2013; no TA                            or [facility] Dec 2018) Medicines:                Monitored Anesthesia Care Procedure:                Pre-Anesthesia Assessment:                           - Prior to the procedure, a History and Physical                            was performed, and patient medications and                            allergies were reviewed. The patient's tolerance of                            previous anesthesia was also reviewed. The risks                            and benefits of the procedure and the sedation                            options and risks were discussed with the patient.                            All questions were answered, and informed consent                            was obtained. Prior Anticoagulants: The patient has                            taken no anticoagulant or antiplatelet agents. ASA                            Grade Assessment: II - A patient with mild systemic  disease. After reviewing the risks and benefits,                            the patient was deemed in satisfactory condition to                            undergo the procedure.                           After obtaining informed consent, the colonoscope                            was passed under direct vision. Throughout the                            procedure, the patient's blood pressure, pulse, and                             oxygen saturations were monitored continuously. The                            Olympus PCF-H190DL ([id number]) Colonoscope was                            introduced through the anus and advanced to the the                            cecum, identified by appendiceal orifice and                            ileocecal valve. The colonoscopy was performed                            without difficulty. The patient tolerated the                            procedure well. The quality of the bowel                            preparation was good. The ileocecal valve,                            appendiceal orifice, and rectum were photographed. Scope In: 2:19:36 PM Scope Out: 2:34:15 PM Scope Withdrawal Time: 0 hours 8 minutes 51 seconds  Total Procedure Duration: 0 hours 14 minutes 39 seconds  Findings:                 Repeat examination of right colon under NBI                            performed.                           Multiple diverticula were found in the left colon  and right colon.                           The exam was otherwise without abnormality on                            direct and retroflexion views. Complications:            No immediate complications. Estimated Blood Loss:     Estimated blood loss: none. Impression:               - Diverticulosis in the left colon and in the right                            colon.                           - The examination was otherwise normal on direct                            and retroflexion views.                           - No specimens collected. Recommendation:           - Patient has a contact number available for                            emergencies. The signs and symptoms of potential                            delayed complications were discussed with the                            patient. Return to normal activities tomorrow.                            Written discharge instructions  were provided to the                            patient.                           - Resume previous diet.                           - Continue present medications.                           - Repeat colonoscopy in 5 years for screening                            purposes (Fam Hx CRC). Rembert Browe L. Myrtie Neither, MD 09/08/2022 2:37:55 PM This report has been signed electronically.

## 2022-09-08 NOTE — Patient Instructions (Signed)
Please read handouts provided. Continue present medications. Repeat colonoscopy in 5 years for screening.   YOU HAD AN ENDOSCOPIC PROCEDURE TODAY AT THE [facility]:   Refer to the procedure report that was given to you for any specific questions about what was found during the examination.  If the procedure report does not answer your questions, please call your gastroenterologist to clarify.  If you requested that your care partner not be given the details of your procedure findings, then the procedure report has been included in a sealed envelope for you to review at your convenience later.  YOU SHOULD EXPECT: Some feelings of bloating in the abdomen. Passage of more gas than usual.  Walking can help get rid of the air that was put into your GI tract during the procedure and reduce the bloating. If you had a lower endoscopy (such as a colonoscopy or flexible sigmoidoscopy) you may notice spotting of blood in your stool or on the toilet paper. If you underwent a bowel prep for your procedure, you may not have a normal bowel movement for a few days.  Please Note:  You might notice some irritation and congestion in your nose or some drainage.  This is from the oxygen used during your procedure.  There is no need for concern and it should clear up in a day or so.  SYMPTOMS TO REPORT IMMEDIATELY:  Following lower endoscopy (colonoscopy or flexible sigmoidoscopy):  Excessive amounts of blood in the stool  Significant tenderness or worsening of abdominal pains  Swelling of the abdomen that is new, acute  Fever of 100F or higher  For urgent or emergent issues, a gastroenterologist can be reached at any hour by calling [phone]. Do not use MyChart messaging for urgent concerns.    DIET:  We do recommend a small meal at first, but then you may proceed to your regular diet.  Drink plenty of fluids but you should avoid alcoholic beverages for 24 hours.  ACTIVITY:  You should plan  to take it easy for the rest of today and you should NOT DRIVE or use heavy machinery until tomorrow (because of the sedation medicines used during the test).    FOLLOW UP: Our staff will call the number listed on your records the next business day following your procedure.  We will call around 7:15- 8:00 am to check on you and address any questions or concerns that you may have regarding the information given to you following your procedure. If we do not reach you, we will leave a message.     If any biopsies were taken you will be contacted by phone or by letter within the next 1-3 weeks.  Please call us at [phone] if you have not heard about the biopsies in 3 weeks.    SIGNATURES/CONFIDENTIALITY: You and/or your care partner have signed paperwork which will be entered into your electronic medical record.  These signatures attest to the fact that that the information above on your After Visit Summary has been reviewed and is understood.  Full responsibility of the confidentiality of this discharge information lies with you and/or your care-partner. 

## 2022-09-08 NOTE — Progress Notes (Signed)
Sedate, gd SR, tolerated procedure well, VSS, report to RN 

## 2022-09-08 NOTE — Progress Notes (Signed)
History and Physical:  This patient presents for endoscopic testing for: Encounter Diagnoses  Name Primary?   Personal history of colon cancer Yes   Family history of malignant neoplasm of gastrointestinal tract     No TA or SSP on last colonoscopy 03/2017 Feb 103 - < 10mm TA Father had CRC Patient denies chronic abdominal pain, rectal bleeding, constipation or diarrhea.   Patient is otherwise without complaints or active issues today.   Past Medical History: Past Medical History:  Diagnosis Date   Allergy    ANEMIA 09/25/2009   Diverticular disease 2013   Hemorrhoid    HYPERLIPIDEMIA 12/07/2007   Kidney stone    OSTEOPENIA 12/07/2007   PERIMENOPAUSAL SYNDROME 12/10/2008     Past Surgical History: Past Surgical History:  Procedure Laterality Date   COLONOSCOPY     CYSTOSCOPY     ?2000   FRACTURE SURGERY  2000   finger-pins placed. "hard to wake up from anesthesia" per pt   POLYPECTOMY     TONSILLECTOMY      Allergies: Allergies  Allergen Reactions   Codeine Phosphate Nausea And Vomiting    Outpatient Meds: Current Outpatient Medications  Medication Sig Dispense Refill   aspirin 81 MG tablet Take 81 mg by mouth daily.       Lutein-Zeaxanthin 15-0.7 MG CAPS Take 1 capsule by mouth daily.  0   PEG-KCl-NaCl-NaSulf-Na Asc-C (PLENVU) 140 g SOLR Take 1 kit by mouth as directed. BIN:  [id number] PCN:  [id number]  group:  [id number]  member ID: [id number] 1 each 0   rosuvastatin (CRESTOR) 10 MG tablet Take 1 tablet (10 mg total) by mouth daily. 90 tablet 1   Vitamin D, Ergocalciferol, (DRISDOL) 1.25 MG (50000 UNIT) CAPS capsule Take 1 capsule (50,000 Units total) by mouth every 7 (seven) days. 12 capsule 0   Current Facility-Administered Medications  Medication Dose Route Frequency Provider Last Rate Last Admin   0.9 %  sodium chloride infusion  500 mL Intravenous Continuous Danis, Starr Lake III, MD           ___________________________________________________________________ Objective   Exam:  BP 113/73   Pulse 95   Temp 98 F (36.7 C) (Skin)   Ht 5\' 5"  (1.651 m)   Wt 145 lb (65.8 kg)   SpO2 95%   BMI 24.13 kg/m   CV: regular , S1/S2 Resp: clear to auscultation bilaterally, normal RR and effort noted GI: soft, no tenderness, with active bowel sounds.   Assessment: Encounter Diagnoses  Name Primary?   Personal history of colon cancer Yes   Family history of malignant neoplasm of gastrointestinal tract      Plan: Colonoscopy    The benefits and risks of the planned procedure were described in detail with the patient or (when appropriate) their health care proxy.  Risks were outlined as including, but not limited to, bleeding, infection, perforation, adverse medication reaction leading to cardiac or pulmonary decompensation, pancreatitis (if ERCP).  The limitation of incomplete mucosal visualization was also discussed.  No guarantees or warranties were given.  The patient is appropriate for an endoscopic procedure in the ambulatory setting.   - Amada Jupiter, MD

## 2022-09-08 NOTE — Progress Notes (Signed)
Patient states there have been no changes to medical or surgical history since time of pre-visit.

## 2022-09-09 ENCOUNTER — Telehealth: Payer: Self-pay

## 2022-09-09 NOTE — Telephone Encounter (Signed)
Left message on follow up call. 

## 2022-10-04 ENCOUNTER — Ambulatory Visit
Admission: RE | Admit: 2022-10-04 | Discharge: 2022-10-04 | Disposition: A | Discharge status: Home / Self Care | Payer: Medicare Other | Source: Ambulatory Visit | Attending: Family | Admitting: Family

## 2022-10-04 ENCOUNTER — Telehealth: Payer: Self-pay | Admitting: Family

## 2022-10-04 DIAGNOSIS — N951 Menopausal and female climacteric states: Secondary | ICD-10-CM | POA: Diagnosis not present

## 2022-10-04 DIAGNOSIS — Z8781 Personal history of (healed) traumatic fracture: Secondary | ICD-10-CM | POA: Diagnosis not present

## 2022-10-04 DIAGNOSIS — E2839 Other primary ovarian failure: Secondary | ICD-10-CM | POA: Diagnosis not present

## 2022-10-04 DIAGNOSIS — E559 Vitamin D deficiency, unspecified: Secondary | ICD-10-CM | POA: Diagnosis not present

## 2022-10-04 DIAGNOSIS — M810 Age-related osteoporosis without current pathological fracture: Secondary | ICD-10-CM

## 2022-10-04 MED ORDER — CALTRATE 600+D PLUS MINERALS 600-800 MG-UNIT PO TABS: 1.0000 | ORAL_TABLET | Freq: Two times a day (BID) | ORAL | Status: AC

## 2022-10-04 MED ORDER — ALENDRONATE SODIUM 70 MG PO TABS: 70.0000 mg | ORAL_TABLET | ORAL | 4 refills | Status: DC

## 2022-10-04 NOTE — Telephone Encounter (Signed)
Please advise pt that her bone density shows osteoporosis.  I would recommend that she begin fosamax once weekly- take by mouth in AM on empty stomach and sit upright for 90 minutes after taking. Rx sent to [facility].   I would like to repeat her vitamin D level.    Add caltrate 600mg  bid.

## 2022-11-08 ENCOUNTER — Ambulatory Visit
Admission: EM | Admit: 2022-11-08 | Discharge: 2022-11-08 | Disposition: A | Discharge status: Home / Self Care | Payer: Medicare Other | Attending: Physician Assistant | Admitting: Physician Assistant

## 2022-11-08 ENCOUNTER — Ambulatory Visit: Payer: Medicare Other | Admitting: Internal Medicine

## 2022-11-08 ENCOUNTER — Encounter: Payer: Self-pay | Admitting: Internal Medicine

## 2022-11-08 DIAGNOSIS — H60502 Unspecified acute noninfective otitis externa, left ear: Secondary | ICD-10-CM | POA: Diagnosis not present

## 2022-11-08 DIAGNOSIS — H9202 Otalgia, left ear: Secondary | ICD-10-CM

## 2022-11-08 MED ORDER — OFLOXACIN 0.3 % OT SOLN: 5.0000 [drp] | Freq: Two times a day (BID) | OTIC | 0 refills | Status: DC

## 2022-11-08 NOTE — ED Triage Notes (Signed)
Pt presents with c/o lt ear pain and soreness X 1 week.

## 2022-11-08 NOTE — Discharge Instructions (Signed)
I am concerned about an ear infection in your ear canal.  Please use ofloxacin drops twice daily.  Keep your ear facing upward for a few minutes after applying drops to allow them good to go all the way through the ear canal.  Use Tylenol and ibuprofen for pain.  Do not put anything in the ear.  Avoid submerging your head in water for at least 1 week.  If anything worsens you have increasing pain, more frequent pain, fever, nausea, vomiting, drainage from the ear you should be seen immediately.

## 2022-11-08 NOTE — ED Provider Notes (Signed)
[facility]    CSN: [id number] Arrival date & time: 11/08/22  1656      History   Chief Complaint Chief Complaint  Patient presents with   Ear Pain    HPI Megan Lutz is a 72 y.o. female.   Patient presents today with her granddaughter to help provide the majority of history.  Reports a 1 week history of left otalgia.  She reports that it is very sore particularly with manipulation of the external ear.  Does report that she went swimming a few weeks ago in a new pool.  She does occasionally use Q-tips on the outside of her ear but typically does not place them in the ear canal.  Does not use earbuds or earplugs on a regular basis.  Denies any recent illness including cough, congestion, fever, nausea, vomiting.  Denies history of diabetes or immunosuppression.  She has taken Tylenol for associated headache but has not tried other over-the-counter medications for symptom management.    Past Medical History:  Diagnosis Date   Allergy    ANEMIA 09/25/2009   Diverticular disease 2013   Hemorrhoid    HYPERLIPIDEMIA 12/07/2007   Kidney stone    OSTEOPENIA 12/07/2007   PERIMENOPAUSAL SYNDROME 12/10/2008    Patient Active Problem List   Diagnosis Date Noted   Osteoporosis without current pathological fracture 10/04/2022   Stress due to family tension 05/07/2022   Viral URI with cough 05/07/2022   Left sided sciatica 01/21/2021   Preventative health care 10/29/2020   Dizziness 10/07/2020   Cough 10/07/2020   Vitamin D deficiency 05/11/2019   Osteopenia 05/11/2019   History of kidney stones 05/11/2019   Anemia 09/25/2009   PERIMENOPAUSAL SYNDROME 12/10/2008   Hyperlipidemia 12/07/2007   Disorder of bone and cartilage 12/07/2007    Past Surgical History:  Procedure Laterality Date   COLONOSCOPY     CYSTOSCOPY     ?2000   FRACTURE SURGERY  2000   finger-pins placed. "hard to wake up from anesthesia" per pt   POLYPECTOMY     TONSILLECTOMY      OB History    No obstetric history on file.      Home Medications    Prior to Admission medications   Medication Sig Start Date End Date Taking? Authorizing Provider  ofloxacin (FLOXIN) 0.3 % OTIC solution Place 5 drops into the left ear 2 (two) times daily. 11/08/22  Yes Ashani Pumphrey, Noberto Retort, PA-C  alendronate (FOSAMAX) 70 MG tablet Take 1 tablet (70 mg total) by mouth every 7 (seven) days. Take with a full glass of water on an empty stomach. 10/04/22   Sandford Craze, NP  aspirin 81 MG tablet Take 81 mg by mouth daily.      [provider]  Calcium Carbonate-Vit D-Min (CALTRATE 600+D PLUS MINERALS) 600-800 MG-UNIT TABS Take 1 tablet by mouth 2 (two) times daily. 10/04/22   Sandford Craze, NP  Lutein-Zeaxanthin 15-0.7 MG CAPS Take 1 capsule by mouth daily. 06/23/21   Sandford Craze, NP  PEG-KCl-NaCl-NaSulf-Na Asc-C (PLENVU) 140 g SOLR Take 1 kit by mouth as directed. BIN:  [id number] PCN:  [id number]  group:  [id number]  member ID: [id number] 09/03/22   Sherrilyn Rist, MD  rosuvastatin (CRESTOR) 10 MG tablet Take 1 tablet (10 mg total) by mouth daily. 05/09/22   Sandford Craze, NP  Vitamin D, Ergocalciferol, (DRISDOL) 1.25 MG (50000 UNIT) CAPS capsule Take 1 capsule (50,000 Units total) by mouth every 7 (seven) days. 05/09/22  Sandford Craze, NP    Family History Family History  Problem Relation Age of Onset   Cancer Mother    Cancer Father    Colon cancer Father 13   Colon polyps Neg Hx    Esophageal cancer Neg Hx    Rectal cancer Neg Hx    Stomach cancer Neg Hx     Social History Social History   Tobacco Use   Smoking status: Never   Smokeless tobacco: Never  Vaping Use   Vaping status: Never Used  Substance Use Topics   Alcohol use: Yes    Comment: rare   Drug use: No     Allergies   Codeine phosphate   Review of Systems Review of Systems  Constitutional:  Negative for activity change, appetite change, fatigue and fever.  HENT:  Positive for ear pain.  Negative for congestion, ear discharge, sinus pressure, sneezing and sore throat.   Respiratory:  Negative for cough and shortness of breath.   Cardiovascular:  Negative for chest pain.  Gastrointestinal:  Negative for abdominal pain, diarrhea, nausea and vomiting.     Physical Exam Triage Vital Signs ED Triage Vitals  Encounter Vitals Group     BP 11/08/22 1709 111/74     Systolic BP Percentile --      Diastolic BP Percentile --      Pulse Rate 11/08/22 1708 85     Resp 11/08/22 1708 18     Temp 11/08/22 1708 98.6 F (37 C)     Temp Source 11/08/22 1708 Oral     SpO2 11/08/22 1708 93 %     Weight --      Height --      Head Circumference --      Peak Flow --      Pain Score 11/08/22 1707 4     Pain Loc --      Pain Education --      Exclude from Growth Chart --    No data found.  Updated Vital Signs BP 111/74 (BP Location: Left Arm)   Pulse 85   Temp 98.6 F (37 C) (Oral)   Resp 18   SpO2 93%   Visual Acuity Right Eye Distance:   Left Eye Distance:   Bilateral Distance:    Right Eye Near:   Left Eye Near:    Bilateral Near:     Physical Exam Vitals reviewed.  Constitutional:      General: She is awake. She is not in acute distress.    Appearance: Normal appearance. She is well-developed. She is not ill-appearing.     Comments: Very pleasant female appears stated age in no acute distress sitting comfortably in exam room  HENT:     Head: Normocephalic and atraumatic.     Right Ear: Tympanic membrane, ear canal and external ear normal. Tympanic membrane is not erythematous or bulging.     Left Ear: Tympanic membrane, ear canal and external ear normal. Swelling and tenderness present. Tympanic membrane is not erythematous or bulging.     Ears:     Comments: Left ear: Small amount of cerumen noted ear canal; able to visualize approximately 75% of TM that appears normal.  Tenderness palpation of tragus and pain with manipulation of external ear.  External  auditory canal is erythematous and edematous.    Mouth/Throat:     Pharynx: Uvula midline. No oropharyngeal exudate or posterior oropharyngeal erythema.  Cardiovascular:     Rate and Rhythm: Normal rate  and regular rhythm.     Heart sounds: Normal heart sounds, S1 normal and S2 normal. No murmur heard. Pulmonary:     Effort: Pulmonary effort is normal.     Breath sounds: Normal breath sounds. No wheezing, rhonchi or rales.     Comments: Clear to auscultation bilaterally Psychiatric:        Behavior: Behavior is cooperative.      UC Treatments / Results  Labs (all labs ordered are listed, but only abnormal results are displayed) Labs Reviewed - No data to display  EKG   Radiology No results found.  Procedures Procedures (including critical care time)  Medications Ordered in UC Medications - No data to display  Initial Impression / Assessment and Plan / UC Course  I have reviewed the triage vital signs and the nursing notes.  Pertinent labs & imaging results that were available during my care of the patient were reviewed by me and considered in my medical decision making (see chart for details).     Patient is well-appearing, afebrile, nontoxic, nontachycardic.  Concern for otitis externa given clinical presentation.  Will start ofloxacin drops twice daily.  She was encouraged to keep her ear facing upward for a few minutes after starting medication to allow medication throughout the ear canal.  She can use over-the-counter analgesics for pain relief.  Discussed that if she has persistent symptoms despite medication she should return for reevaluation.  If anything worsens and she has increasing pain, fever, nausea, vomiting, otorrhea she should be seen immediately.  Strict return precautions given.  Questions answered to patient and caregiver satisfaction.  Final Clinical Impressions(s) / UC Diagnoses   Final diagnoses:  Acute otitis externa of left ear, unspecified type   Otalgia of left ear     Discharge Instructions      I am concerned about an ear infection in your ear canal.  Please use ofloxacin drops twice daily.  Keep your ear facing upward for a few minutes after applying drops to allow them good to go all the way through the ear canal.  Use Tylenol and ibuprofen for pain.  Do not put anything in the ear.  Avoid submerging your head in water for at least 1 week.  If anything worsens you have increasing pain, more frequent pain, fever, nausea, vomiting, drainage from the ear you should be seen immediately.     ED Prescriptions     Medication Sig Dispense Auth. Provider   ofloxacin (FLOXIN) 0.3 % OTIC solution Place 5 drops into the left ear 2 (two) times daily. 5 mL Quin Mathenia K, PA-C      PDMP not reviewed this encounter.   Jeani Hawking, PA-C 11/08/22 1759

## 2022-11-09 ENCOUNTER — Ambulatory Visit: Payer: Medicare Other | Admitting: Family

## 2022-11-12 ENCOUNTER — Ambulatory Visit: Payer: Medicare Other | Admitting: Physician Assistant

## 2022-11-12 ENCOUNTER — Encounter: Payer: Self-pay | Admitting: Physician Assistant

## 2022-11-12 VITALS — BP 115/69 | HR 60 | Temp 97.4°F | Resp 20 | Wt 149.0 lb

## 2022-11-12 DIAGNOSIS — H60392 Other infective otitis externa, left ear: Secondary | ICD-10-CM | POA: Diagnosis not present

## 2022-11-12 MED ORDER — AMOXICILLIN 875 MG PO TABS: 875.0000 mg | ORAL_TABLET | Freq: Two times a day (BID) | ORAL | 0 refills | Status: DC

## 2022-11-12 NOTE — Progress Notes (Signed)
Established patient visit   Patient: Megan Lutz   DOB: December 27, 1950   72 y.o. Female  MRN: [id number] Visit Date: 11/12/2022  Today's healthcare provider: Alfredia Ferguson, PA-C   Cc. Left ear pain  Subjective    HPI  Pt was seen in ED 11/08/22 for left ear pain, dx with otitis externa and given ofloxacin ear drops. Reports worsening pain, sharp shooting. Continued discharge. Some decreased hearing on the left side.  Medications: Outpatient Medications Prior to Visit  Medication Sig   alendronate (FOSAMAX) 70 MG tablet Take 1 tablet (70 mg total) by mouth every 7 (seven) days. Take with a full glass of water on an empty stomach.   aspirin 81 MG tablet Take 81 mg by mouth daily.     Calcium Carbonate-Vit D-Min (CALTRATE 600+D PLUS MINERALS) 600-800 MG-UNIT TABS Take 1 tablet by mouth 2 (two) times daily.   Lutein-Zeaxanthin 15-0.7 MG CAPS Take 1 capsule by mouth daily.   ofloxacin (FLOXIN) 0.3 % OTIC solution Place 5 drops into the left ear 2 (two) times daily.   PEG-KCl-NaCl-NaSulf-Na Asc-C (PLENVU) 140 g SOLR Take 1 kit by mouth as directed. BIN:  [id number] PCN:  CNRX  group:  [id number]  member ID: [id number]   rosuvastatin (CRESTOR) 10 MG tablet Take 1 tablet (10 mg total) by mouth daily.   Vitamin D, Ergocalciferol, (DRISDOL) 1.25 MG (50000 UNIT) CAPS capsule Take 1 capsule (50,000 Units total) by mouth every 7 (seven) days.   No facility-administered medications prior to visit.    Review of Systems  Constitutional:  Negative for fatigue and fever.  HENT:  Positive for ear discharge and ear pain.   Respiratory:  Negative for cough and shortness of breath.   Cardiovascular:  Negative for chest pain and leg swelling.  Gastrointestinal:  Negative for abdominal pain.  Neurological:  Negative for dizziness and headaches.       Objective    BP 115/69 (BP Location: Left Arm, Patient Position: Sitting, Cuff Size: Small)   Pulse 60   Temp (!) 97.4 F (36.3 C) (Oral)    Resp 20   Wt 149 lb (67.6 kg)   SpO2 98%   BMI 24.79 kg/m    Physical Exam Vitals reviewed.  Constitutional:      Appearance: She is not ill-appearing.  HENT:     Head: Normocephalic.     Right Ear: Tympanic membrane normal.     Ears:     Comments: White discharge in the left canal, with erythema to the canal and outer ear. Unable to fully visualize TM d/t discharge Eyes:     Conjunctiva/sclera: Conjunctivae normal.  Cardiovascular:     Rate and Rhythm: Normal rate.  Pulmonary:     Effort: Pulmonary effort is normal. No respiratory distress.  Neurological:     General: No focal deficit present.     Mental Status: She is alert and oriented to person, place, and time.  Psychiatric:        Mood and Affect: Mood normal.        Behavior: Behavior normal.      No results found for any visits on 11/12/22.  Assessment & Plan     1. Other infective acute otitis externa of left ear Cont drops until symptoms improve Rx amoxicillin bid x 10 days, hydrate, take with food.  - amoxicillin (AMOXIL) 875 MG tablet; Take 1 tablet (875 mg total) by mouth 2 (two) times daily for 10 days.  Dispense: 20 tablet; Refill: 0   Return if symptoms worsen or fail to improve.      I, Alfredia Ferguson, PA-C have reviewed all documentation for this visit. The documentation on  11/12/22   for the exam, diagnosis, procedures, and orders are all accurate and complete.  Alfredia Ferguson, PA-C  [facility] [phone] (phone) [phone] (fax)  [facility]

## 2022-11-13 ENCOUNTER — Emergency Department (HOSPITAL_BASED_OUTPATIENT_CLINIC_OR_DEPARTMENT_OTHER)
Admission: EM | Admit: 2022-11-13 | Discharge: 2022-11-14 | Disposition: A | Discharge status: Home / Self Care | Payer: Medicare Other | Attending: Emergency Medicine | Admitting: Emergency Medicine

## 2022-11-13 ENCOUNTER — Other Ambulatory Visit: Payer: Self-pay

## 2022-11-13 ENCOUNTER — Encounter (HOSPITAL_BASED_OUTPATIENT_CLINIC_OR_DEPARTMENT_OTHER): Payer: Self-pay | Admitting: Emergency Medicine

## 2022-11-13 DIAGNOSIS — B349 Viral infection, unspecified: Secondary | ICD-10-CM | POA: Diagnosis not present

## 2022-11-13 DIAGNOSIS — R9431 Abnormal electrocardiogram [ECG] [EKG]: Secondary | ICD-10-CM | POA: Diagnosis not present

## 2022-11-13 DIAGNOSIS — G510 Bell's palsy: Secondary | ICD-10-CM | POA: Diagnosis not present

## 2022-11-13 DIAGNOSIS — B9789 Other viral agents as the cause of diseases classified elsewhere: Secondary | ICD-10-CM | POA: Insufficient documentation

## 2022-11-13 DIAGNOSIS — H60392 Other infective otitis externa, left ear: Secondary | ICD-10-CM

## 2022-11-13 DIAGNOSIS — H6092 Unspecified otitis externa, left ear: Secondary | ICD-10-CM | POA: Diagnosis not present

## 2022-11-13 DIAGNOSIS — Z7982 Long term (current) use of aspirin: Secondary | ICD-10-CM | POA: Insufficient documentation

## 2022-11-13 DIAGNOSIS — R29810 Facial weakness: Secondary | ICD-10-CM | POA: Diagnosis not present

## 2022-11-13 LAB — CBG MONITORING, ED: Glucose-Capillary: 149 mg/dL — ABNORMAL HIGH (ref 70–99)

## 2022-11-13 MED ORDER — VALACYCLOVIR HCL 500 MG PO TABS
1000.0000 mg | ORAL_TABLET | Freq: Once | ORAL | Status: AC
  Administered 2022-11-13: 1000 mg via ORAL
  Filled 2022-11-13: qty 2

## 2022-11-13 MED ORDER — PREDNISONE 50 MG PO TABS
60.0000 mg | ORAL_TABLET | Freq: Once | ORAL | Status: AC
  Administered 2022-11-13: 60 mg via ORAL
  Filled 2022-11-13: qty 1

## 2022-11-13 MED ORDER — VALACYCLOVIR HCL 1 G PO TABS: 1000.0000 mg | ORAL_TABLET | Freq: Three times a day (TID) | ORAL | 0 refills | Status: DC

## 2022-11-13 MED ORDER — PREDNISONE 20 MG PO TABS: 60.0000 mg | ORAL_TABLET | Freq: Every day | ORAL | 0 refills | Status: DC

## 2022-11-13 NOTE — ED Triage Notes (Signed)
Pt with LT side facial droop since 1300; HA on and off for a few days; denies other weakness or numbness

## 2022-11-13 NOTE — ED Provider Notes (Signed)
[place] [facility]  Provider Note  CSN: [id number] Arrival date & time: 11/13/22 2255  History Chief Complaint  Patient presents with   Facial Droop    Megan Lutz is a 72 y.o. female with no significant PMH has had L ear pain for the last week, saw Urgent Care initially and started on ofloxacin drops without much improvement. Seen at PCP yesterday and had Augmentin added. Around 1300hrs today she noticed her L face was drooping, drooling out of L mouth and unable to close her L eye. She has intermittent sharp stabbing pains in her L ear still. No dysarthria, aphasia, visual changes, arm or leg numbness/weakness.    Home Medications Prior to Admission medications   Medication Sig Start Date End Date Taking? Authorizing Provider  predniSONE (DELTASONE) 20 MG tablet Take 3 tablets (60 mg total) by mouth daily for 7 days. 11/13/22 11/20/22 Yes Pollyann Savoy, MD  valACYclovir (VALTREX) 1000 MG tablet Take 1 tablet (1,000 mg total) by mouth 3 (three) times daily. 11/13/22  Yes Pollyann Savoy, MD  alendronate (FOSAMAX) 70 MG tablet Take 1 tablet (70 mg total) by mouth every 7 (seven) days. Take with a full glass of water on an empty stomach. 10/04/22   Sandford Craze, NP  aspirin 81 MG tablet Take 81 mg by mouth daily.      [provider]  Calcium Carbonate-Vit D-Min (CALTRATE 600+D PLUS MINERALS) 600-800 MG-UNIT TABS Take 1 tablet by mouth 2 (two) times daily. 10/04/22   Sandford Craze, NP  Lutein-Zeaxanthin 15-0.7 MG CAPS Take 1 capsule by mouth daily. 06/23/21   Sandford Craze, NP  PEG-KCl-NaCl-NaSulf-Na Asc-C (PLENVU) 140 g SOLR Take 1 kit by mouth as directed. BIN:  [id number] PCN:  [id number]  group:  [id number]  member ID: [id number] 09/03/22   Sherrilyn Rist, MD  rosuvastatin (CRESTOR) 10 MG tablet Take 1 tablet (10 mg total) by mouth daily. 05/09/22   Sandford Craze, NP  Vitamin D, Ergocalciferol, (DRISDOL) 1.25 MG  (50000 UNIT) CAPS capsule Take 1 capsule (50,000 Units total) by mouth every 7 (seven) days. 05/09/22   Sandford Craze, NP     Allergies    Codeine phosphate   Review of Systems   Review of Systems Please see HPI for pertinent positives and negatives  Physical Exam BP 129/81   Pulse 68   Temp 98 F (36.7 C) (Oral)   Resp 10   Ht 5\' 5"  (1.651 m)   Wt 67.1 kg   SpO2 96%   BMI 24.63 kg/m   Physical Exam Vitals and nursing note reviewed.  Constitutional:      Appearance: Normal appearance.  HENT:     Head: Normocephalic and atraumatic.     Ears:     Comments: L TM partially obscured by wax, there is some erythema of the visualized TM, no definite bullae.     Nose: Nose normal.     Mouth/Throat:     Mouth: Mucous membranes are moist.  Eyes:     Extraocular Movements: Extraocular movements intact.     Conjunctiva/sclera: Conjunctivae normal.  Cardiovascular:     Rate and Rhythm: Normal rate.  Pulmonary:     Effort: Pulmonary effort is normal.     Breath sounds: Normal breath sounds.  Abdominal:     General: Abdomen is flat.     Palpations: Abdomen is soft.     Tenderness: There is no abdominal tenderness.  Musculoskeletal:  General: No swelling. Normal range of motion.     Cervical back: Neck supple.  Skin:    General: Skin is warm and dry.  Neurological:     Mental Status: She is alert and oriented to person, place, and time.     Sensory: No sensory deficit.     Motor: No weakness.     Coordination: Coordination normal.     Gait: Gait normal.     Comments: L facial nerve palsy includes forehead, no visual field deficits, dysarthria, or aphasia  Psychiatric:        Mood and Affect: Mood normal.     ED Results / Procedures / Treatments   EKG EKG Interpretation Date/Time:  Saturday November 13 2022 23:11:54 EDT Ventricular Rate:  67 PR Interval:  130 QRS Duration:  82 QT Interval:  414 QTC Calculation: 437 R Axis:   71  Text  Interpretation: Sinus rhythm Low voltage, precordial leads No significant change since last tracing Confirmed by Susy Frizzle [phone]) on 11/13/2022 11:21:39 PM  Procedures Procedures  Medications Ordered in the ED Medications  valACYclovir (VALTREX) tablet 1,000 mg (has no administration in time range)  predniSONE (DELTASONE) tablet 60 mg (has no administration in time range)    Initial Impression and Plan  Patient here with about a week of L ear pain and now with symptoms of bell's palsy. She has involvement of her forehead and no other focal neuro deficits to suggest a central cause. Her L ear still erythematous after several days of drops and three doses of Amoxil. Suspect this is a viral infection, likely zoster or HSV, causing both her ear pain and her facial nerve palsy. Advised no specific testing is needed, will begin antivirals and prednisone. Given instructions on artifical tears and taping eye shut at night. Neuro follow up if not improving. RTED for any other concerns.   ED Course       MDM Rules/Calculators/A&P Medical Decision Making Problems Addressed: Bell's palsy: acute illness or injury Viral otitis externa of left ear: acute illness or injury  Risk Prescription drug management.     Final Clinical Impression(s) / ED Diagnoses Final diagnoses:  Bell's palsy  Viral otitis externa of left ear    Rx / DC Orders ED Discharge Orders          Ordered    predniSONE (DELTASONE) 20 MG tablet  Daily        11/13/22 2327    valACYclovir (VALTREX) 1000 MG tablet  3 times daily        11/13/22 2327             Pollyann Savoy, MD 11/13/22 2328

## 2022-11-14 ENCOUNTER — Telehealth (HOSPITAL_BASED_OUTPATIENT_CLINIC_OR_DEPARTMENT_OTHER): Payer: Self-pay | Admitting: Emergency Medicine

## 2022-11-14 MED ORDER — VALACYCLOVIR HCL 1 G PO TABS: 1000.0000 mg | ORAL_TABLET | Freq: Three times a day (TID) | ORAL | 0 refills | Status: DC

## 2022-11-14 MED ORDER — PREDNISONE 20 MG PO TABS: 60.0000 mg | ORAL_TABLET | Freq: Every day | ORAL | 0 refills | Status: AC

## 2022-11-14 NOTE — Telephone Encounter (Signed)
Patient needs medications resent to pharmacy open today.

## 2022-11-23 DIAGNOSIS — H353131 Nonexudative age-related macular degeneration, bilateral, early dry stage: Secondary | ICD-10-CM | POA: Diagnosis not present

## 2022-11-23 DIAGNOSIS — H2513 Age-related nuclear cataract, bilateral: Secondary | ICD-10-CM | POA: Diagnosis not present

## 2022-12-27 ENCOUNTER — Other Ambulatory Visit: Payer: Self-pay | Admitting: Family

## 2023-03-28 ENCOUNTER — Encounter: Payer: Self-pay | Admitting: Pharmacist

## 2023-03-28 ENCOUNTER — Other Ambulatory Visit: Payer: Self-pay | Admitting: Pharmacist

## 2023-03-28 MED ORDER — ROSUVASTATIN CALCIUM 10 MG PO TABS: 10.0000 mg | ORAL_TABLET | Freq: Every day | ORAL | 0 refills | Status: DC

## 2023-03-28 NOTE — Progress Notes (Signed)
Pharmacy Quality Measure Review  This patient is appearing on a report for being at risk of failing the adherence measure for cholesterol (statin) medications this calendar year.   Medication: ROSUVASTATIN  Last fill date: 12/27/2022 for 90 day supply  Left voicemail for patient to return my call at their convenience. and Will collaborate with provider to facilitate refill needs.  Henrene Pastor, PharmD Clinical Pharmacist [facility]

## 2023-03-28 NOTE — Telephone Encounter (Signed)
Patient is due refill for rosuvastatin - sent in updated Rx

## 2023-05-17 ENCOUNTER — Ambulatory Visit (INDEPENDENT_AMBULATORY_CARE_PROVIDER_SITE_OTHER): Payer: Medicare Other | Admitting: Family

## 2023-05-17 VITALS — BP 127/73 | HR 79 | Temp 98.7°F | Resp 16 | Ht 65.0 in | Wt 155.0 lb

## 2023-05-17 DIAGNOSIS — H60502 Unspecified acute noninfective otitis externa, left ear: Secondary | ICD-10-CM | POA: Insufficient documentation

## 2023-05-17 MED ORDER — OFLOXACIN 0.3 % OT SOLN: 5.0000 [drp] | Freq: Two times a day (BID) | OTIC | 0 refills | Status: DC

## 2023-05-17 NOTE — Progress Notes (Signed)
Subjective:     Patient ID: Megan Lutz, female    DOB: 02/27/1951, 73 y.o.   MRN: [id number]  Chief Complaint  Patient presents with   Ear Pain    Complains of left ear pain    HPI  Discussed the use of AI scribe software for clinical note transcription with the patient, who gave verbal consent to proceed.  History of Present Illness   The patient presents with left ear pain. She is accompanied by her granddaughter.  The patient has been experiencing left ear pain that began a few days ago, initially mild but now described as 'really bad' and 'very sore to touch.' She recalls a similar episode last year that required urgent care intervention.  Previously, she was treated with ear drops and oral medication for a similar condition last year, which was described as 'terrible.' She expresses concern about the current episode being similar to the past experience.  No associated symptoms such as stuffy nose, sore throat, headache, or fever.  She mentions having had Bell's palsy since her last visit, although symptoms have resolved.  She reports left leg pain, described as bothersome and causing limping, which she attributes to aging. No specific diagnosis or treatment plan was discussed.          Health Maintenance Due  Topic Date Due   Hepatitis C Screening  Never done   INFLUENZA VACCINE  11/18/2022   COVID-19 Vaccine (3 - 2024-25 season) 12/19/2022   Medicare Annual Wellness (AWV)  05/28/2023    Past Medical History:  Diagnosis Date   Allergy    ANEMIA 09/25/2009   Diverticular disease 2013   Hemorrhoid    HYPERLIPIDEMIA 12/07/2007   Kidney stone    OSTEOPENIA 12/07/2007   PERIMENOPAUSAL SYNDROME 12/10/2008    Past Surgical History:  Procedure Laterality Date   COLONOSCOPY     CYSTOSCOPY     ?2000   FRACTURE SURGERY  2000   finger-pins placed. "hard to wake up from anesthesia" per pt   POLYPECTOMY     TONSILLECTOMY      Family History  Problem Relation  Age of Onset   Cancer Mother    Cancer Father    Colon cancer Father 30   Colon polyps Neg Hx    Esophageal cancer Neg Hx    Rectal cancer Neg Hx    Stomach cancer Neg Hx     Social History   Socioeconomic History   Marital status: Divorced    Spouse name: Not on file   Number of children: Not on file   Years of education: Not on file   Highest education level: Not on file  Occupational History   Not on file  Tobacco Use   Smoking status: Never   Smokeless tobacco: Never  Vaping Use   Vaping status: Never Used  Substance and Sexual Activity   Alcohol use: Yes    Comment: rare   Drug use: No   Sexual activity: Not Currently  Other Topics Concern   Not on file  Social History Narrative   retired   Son and daughter- both live locally- has a 16 yr old granddaughter   Divorced- 1998, Development worker, 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[place])   Social Drivers of Corporate investment banker Strain: Low Risk  (05/25/2021)   Overall Financial Resource Strain (CARDIA)    Difficulty of Paying Living Expenses: Not hard at all  Food Insecurity: No Food Insecurity (05/27/2022)   Hunger Vital Sign  Worried About Programme researcher, broadcasting/film/video in the Last Year: Never true    Ran Out of Food in the Last Year: Never true  Transportation Needs: No Transportation Needs (05/27/2022)   PRAPARE - Administrator, Civil Service (Medical): No    Lack of Transportation (Non-Medical): No  Physical Activity: Insufficiently Active (05/25/2021)   Exercise Vital Sign    Days of Exercise per Week: 2 days    Minutes of Exercise per Session: 40 min  Stress: No Stress Concern Present (05/25/2021)   [employer] of Occupational Health - Occupational Stress Questionnaire    Feeling of Stress : Only a little  Social Connections: Moderately Isolated (05/25/2021)   Social Connection and Isolation Panel [NHANES]    Frequency of Communication with Friends and Family: More than three times a week    Frequency of Social Gatherings with  Friends and Family: More than three times a week    Attends Religious Services: More than 4 times per year    Active Member of [employer] or Organizations: No    Attends Banker Meetings: Never    Marital Status: Divorced  Catering manager Violence: Not At Risk (05/27/2022)   Humiliation, Afraid, Rape, and Kick questionnaire    Fear of Current or Ex-Partner: No    Emotionally Abused: No    Physically Abused: No    Sexually Abused: No    Outpatient Medications Prior to Visit  Medication Sig Dispense Refill   aspirin 81 MG tablet Take 81 mg by mouth daily.       Calcium Carbonate-Vit D-Min (CALTRATE 600+D PLUS MINERALS) 600-800 MG-UNIT TABS Take 1 tablet by mouth 2 (two) times daily.     Lutein-Zeaxanthin 15-0.7 MG CAPS Take 1 capsule by mouth daily.  0   rosuvastatin (CRESTOR) 10 MG tablet Take 1 tablet (10 mg total) by mouth daily. 90 tablet 0   Vitamin D, Ergocalciferol, (DRISDOL) 1.25 MG (50000 UNIT) CAPS capsule Take 1 capsule (50,000 Units total) by mouth every 7 (seven) days. 12 capsule 0   alendronate (FOSAMAX) 70 MG tablet Take 1 tablet (70 mg total) by mouth every 7 (seven) days. Take with a full glass of water on an empty stomach. (Patient not taking: Reported on 05/17/2023) 12 tablet 4   PEG-KCl-NaCl-NaSulf-Na Asc-C (PLENVU) 140 g SOLR Take 1 kit by mouth as directed. BIN:  [id number] PCN:  [id number]  group:  [id number]  member ID: [id number] 1 each 0   valACYclovir (VALTREX) 1000 MG tablet Take 1 tablet (1,000 mg total) by mouth 3 (three) times daily. 21 tablet 0   No facility-administered medications prior to visit.    Allergies  Allergen Reactions   Codeine Phosphate Nausea And Vomiting    ROS See HPI    Objective:    Physical Exam Constitutional:      General: She is not in acute distress.    Appearance: Normal appearance. She is well-developed.  HENT:     Head: Normocephalic and atraumatic.     Right Ear: Tympanic membrane, ear canal and external ear normal.      Left Ear: Tympanic membrane and external ear normal.     Ears:     Comments: Left canal is erythematous and tender, no exudate Eyes:     General: No scleral icterus. Neck:     Thyroid: No thyromegaly.  Cardiovascular:     Rate and Rhythm: Normal rate and regular rhythm.     Heart sounds: Normal heart sounds. No  murmur heard. Pulmonary:     Effort: Pulmonary effort is normal. No respiratory distress.     Breath sounds: Normal breath sounds. No wheezing.  Musculoskeletal:     Cervical back: Neck supple.  Skin:    General: Skin is warm and dry.  Neurological:     Mental Status: She is alert and oriented to person, place, and time.  Psychiatric:        Mood and Affect: Mood normal.        Behavior: Behavior normal.        Thought Content: Thought content normal.        Judgment: Judgment normal.      BP 127/73 (BP Location: Right Arm, Patient Position: Sitting, Cuff Size: Normal)   Pulse 79   Temp 98.7 F (37.1 C) (Oral)   Resp 16   Ht 5\' 5"  (1.651 m)   Wt 155 lb (70.3 kg)   SpO2 98%   BMI 25.79 kg/m  Wt Readings from Last 3 Encounters:  05/17/23 155 lb (70.3 kg)  11/13/22 148 lb (67.1 kg)  11/12/22 149 lb (67.6 kg)       Assessment & Plan:   Problem List Items Addressed This Visit       Unprioritized   Acute otitis externa of left ear - Primary   New. Rx with ofloxacin.       Relevant Medications   ofloxacin (FLOXIN) 0.3 % OTIC solution    I have discontinued Tyanna Hach. Faraj's Plenvu and valACYclovir. I am also having her start on ofloxacin. Additionally, I am having her maintain her aspirin, Lutein-Zeaxanthin, Vitamin D (Ergocalciferol), alendronate, Caltrate 600+D Plus Minerals, and rosuvastatin.  Meds ordered this encounter  Medications   ofloxacin (FLOXIN) 0.3 % OTIC solution    Sig: Place 5 drops into the left ear 2 (two) times daily.    Dispense:  5 mL    Refill:  0    Supervising Provider:   Danise Edge A [[id number]]

## 2023-05-17 NOTE — Patient Instructions (Signed)
VISIT SUMMARY:  Today, you visited [place] due to left ear pain that has worsened over the past few days. You also mentioned experiencing chronic leg pain, which you attribute to aging. We discussed treatment options for your ear pain and encouraged you to seek further evaluation for your leg pain.  YOUR PLAN:  -EXTERNAL OTITIS: External otitis, also known as swimmer's ear, is an infection of the outer ear canal. We observed inflammation and tenderness in your ear canal, which suggests an external ear infection. We have prescribed ear drops to treat the infection. If the medication is too expensive, we will consider prescribing the medication you used in the past. Please return for re-evaluation if your symptoms do not improve in the next few days.  INSTRUCTIONS:  Please use the prescribed ear drops as directed. If the medication is too expensive, contact us to discuss alternative options. If your ear pain does not improve in the next few days, please return for re-evaluation. Additionally, we recommend that you seek medical attention for your chronic leg pain to determine the cause and appropriate treatment.

## 2023-05-17 NOTE — Assessment & Plan Note (Signed)
New. Rx with ofloxacin.

## 2023-05-18 ENCOUNTER — Encounter (HOSPITAL_BASED_OUTPATIENT_CLINIC_OR_DEPARTMENT_OTHER): Payer: Self-pay | Admitting: Emergency Medicine

## 2023-05-18 ENCOUNTER — Emergency Department (HOSPITAL_BASED_OUTPATIENT_CLINIC_OR_DEPARTMENT_OTHER)
Admission: EM | Admit: 2023-05-18 | Discharge: 2023-05-18 | Disposition: A | Discharge status: Home / Self Care | Payer: Medicare Other | Attending: Emergency Medicine | Admitting: Emergency Medicine

## 2023-05-18 ENCOUNTER — Other Ambulatory Visit: Payer: Self-pay

## 2023-05-18 DIAGNOSIS — H60502 Unspecified acute noninfective otitis externa, left ear: Secondary | ICD-10-CM | POA: Insufficient documentation

## 2023-05-18 DIAGNOSIS — H6092 Unspecified otitis externa, left ear: Secondary | ICD-10-CM | POA: Diagnosis not present

## 2023-05-18 NOTE — Discharge Instructions (Addendum)
You were seen in the ER today for concerns of ear drainage. You appear to have an external ear infection. Antibiotic drops were sent to your pharmacy to help treat this infection yesterday by your primary care provider. This is the correct medication to treat your infection. Please take this as prescribed and follow up with your primary care provider for further evaluation to ensure this is healing. For new or worsening symptoms, return to the ER.

## 2023-05-18 NOTE — ED Triage Notes (Signed)
C/oleft ear drainage. Seen at PCP yesterday and dx w/ bells p yesterday. No facial droop.

## 2023-05-18 NOTE — ED Provider Notes (Signed)
[place] [facility] Provider Note   CSN: [id number] Arrival date & time: 05/18/23  1126     History Chief Complaint  Patient presents with   Ear Drainage    Megan Lutz is a 73 y.o. female. Patient with history of Bells palsy, osteopenia, anemia, and HLD presents to the ED with concerns of ear drainage. States that she was seen by PCP yesterday for similar concerns, diagnosed with acute otitis externa, and prescribed Ofloxacin but has not started this medication. Denies any other symptoms. No hearing loss, fever, chest pain, shortness of breath, or cough.   Ear Drainage       Home Medications Prior to Admission medications   Medication Sig Start Date End Date Taking? Authorizing Provider  alendronate (FOSAMAX) 70 MG tablet Take 1 tablet (70 mg total) by mouth every 7 (seven) days. Take with a full glass of water on an empty stomach. Patient not taking: Reported on 05/17/2023 10/04/22   Sandford Craze, NP  aspirin 81 MG tablet Take 81 mg by mouth daily.      [provider]  Calcium Carbonate-Vit D-Min (CALTRATE 600+D PLUS MINERALS) 600-800 MG-UNIT TABS Take 1 tablet by mouth 2 (two) times daily. 10/04/22   Sandford Craze, NP  Lutein-Zeaxanthin 15-0.7 MG CAPS Take 1 capsule by mouth daily. 06/23/21   Sandford Craze, NP  ofloxacin (FLOXIN) 0.3 % OTIC solution Place 5 drops into the left ear 2 (two) times daily. 05/17/23   Sandford Craze, NP  rosuvastatin (CRESTOR) 10 MG tablet Take 1 tablet (10 mg total) by mouth daily. 03/28/23   Sandford Craze, NP  Vitamin D, Ergocalciferol, (DRISDOL) 1.25 MG (50000 UNIT) CAPS capsule Take 1 capsule (50,000 Units total) by mouth every 7 (seven) days. 05/09/22   Sandford Craze, NP      Allergies    Codeine phosphate    Review of Systems   Review of Systems  HENT:  Positive for ear discharge.   All other systems reviewed and are negative.   Physical Exam Updated  Vital Signs BP (!) 146/83   Pulse 71   Temp 98 F (36.7 C)   Resp 15   Wt 70.2 kg   SpO2 100%   BMI 25.75 kg/m  Physical Exam Vitals and nursing note reviewed.  Constitutional:      General: She is not in acute distress.    Appearance: She is well-developed.  HENT:     Head: Normocephalic and atraumatic.     Right Ear: Tympanic membrane, ear canal and external ear normal. There is no impacted cerumen.     Left Ear: Drainage and swelling present.     Ears:     Comments: Mild swelling, erythema, and drainage from the left external ear canal. No TM injection, perforation, or evidence of middle ear effusion. No significant EAC swelling causing occlusion.  No mastoid tenderness. Eyes:     Conjunctiva/sclera: Conjunctivae normal.  Cardiovascular:     Rate and Rhythm: Normal rate and regular rhythm.     Heart sounds: No murmur heard. Pulmonary:     Effort: Pulmonary effort is normal. No respiratory distress.     Breath sounds: Normal breath sounds.  Abdominal:     Palpations: Abdomen is soft.     Tenderness: There is no abdominal tenderness.  Musculoskeletal:        General: No swelling.     Cervical back: Neck supple.  Skin:    General: Skin is warm and  dry.     Capillary Refill: Capillary refill takes less than 2 seconds.  Neurological:     Mental Status: She is alert.  Psychiatric:        Mood and Affect: Mood normal.     ED Results / Procedures / Treatments   Labs (all labs ordered are listed, but only abnormal results are displayed) Labs Reviewed - No data to display  EKG None  Radiology No results found.  Procedures Procedures    Medications Ordered in ED Medications - No data to display  ED Course/ Medical Decision Making/ A&P                                Medical Decision Making  This patient presents to the ED for concern of ear drainage. Differential diagnosis includes otitis externa, ruptured TM, middle ear effusion, eczema    Problem  List / ED Course:  Patient with past history of Bells palsy, osteopenia, anemia, and HLD presents to the ED with concerns of ear drainage. States her left ear has been having a fluid drain for several days with increasing pain. No hearing loss or muffled hearing. No fever or bodyaches. No posterior ear pain. No recent trauma to the left ear. On exam, the left EAC has some mild erythema and swelling, but no evidence of occluded canal from swelling. No mastoid tenderness. TM is intact without injection or perforation seen. Right ear is unremarkable. I suspect this is likely acute otitis externa. No concern for Bells Palsy at this as there is no facial droop, slurred speech, or neurological deficits. Advised patient to pick up ofloxacin which was sent in by PCP yesterday for treatment of this ear infection. No indication for any other medications to manage this condition at this time. Patient verbalized understanding need to pick up medication and take as prescribed. Patient otherwise stable and discharged home with plans for PCP follow up.    Final Clinical Impression(s) / ED Diagnoses Final diagnoses:  Acute otitis externa of left ear, unspecified type    Rx / DC Orders ED Discharge Orders     None         Smitty Knudsen, PA-C 05/18/23 1600    Terrilee Files, MD 05/18/23 1737

## 2023-05-18 NOTE — ED Notes (Signed)
Pt. Was assessed by PA C and will be treated by the PA C according to his assessment.

## 2023-05-31 ENCOUNTER — Encounter: Payer: Medicare Other | Admitting: Family

## 2023-05-31 ENCOUNTER — Ambulatory Visit (INDEPENDENT_AMBULATORY_CARE_PROVIDER_SITE_OTHER): Payer: Medicare Other

## 2023-05-31 VITALS — BP 110/71 | HR 70 | Ht 65.0 in | Wt 154.2 lb

## 2023-05-31 DIAGNOSIS — Z23 Encounter for immunization: Secondary | ICD-10-CM

## 2023-05-31 DIAGNOSIS — Z0000 Encounter for general adult medical examination without abnormal findings: Secondary | ICD-10-CM | POA: Diagnosis not present

## 2023-05-31 NOTE — Progress Notes (Signed)
Subjective:   Megan Lutz is a 73 y.o. female who presents for Medicare Annual (Subsequent) preventive examination.  Visit Complete: In person Cardiac Risk Factors include: advanced age (>46men, >54 women);dyslipidemia     Objective:    Today's Vitals   05/31/23 0938  BP: 110/71  Pulse: 70  Weight: 154 lb 3.2 oz (69.9 kg)  Height: 5\' 5"  (1.651 m)   Body mass index is 25.66 kg/m.     05/31/2023    9:52 AM 05/18/2023   11:41 AM 11/13/2022   11:02 PM 05/27/2022    9:04 AM 05/25/2021    9:10 AM  Advanced Directives  Does Patient Have a Medical Advance Directive? No No No No No  Would patient like information on creating a medical advance directive? No - Patient declined No - Patient declined  No - Patient declined Yes (MAU/Ambulatory/Procedural Areas - Information given)    Current Medications (verified) Outpatient Encounter Medications as of 05/31/2023  Medication Sig   alendronate (FOSAMAX) 70 MG tablet Take 1 tablet (70 mg total) by mouth every 7 (seven) days. Take with a full glass of water on an empty stomach. (Patient not taking: Reported on 05/17/2023)   aspirin 81 MG tablet Take 81 mg by mouth daily.     Calcium Carbonate-Vit D-Min (CALTRATE 600+D PLUS MINERALS) 600-800 MG-UNIT TABS Take 1 tablet by mouth 2 (two) times daily.   Lutein-Zeaxanthin 15-0.7 MG CAPS Take 1 capsule by mouth daily.   ofloxacin (FLOXIN) 0.3 % OTIC solution Place 5 drops into the left ear 2 (two) times daily.   rosuvastatin (CRESTOR) 10 MG tablet Take 1 tablet (10 mg total) by mouth daily.   Vitamin D, Ergocalciferol, (DRISDOL) 1.25 MG (50000 UNIT) CAPS capsule Take 1 capsule (50,000 Units total) by mouth every 7 (seven) days.   No facility-administered encounter medications on file as of 05/31/2023.    Allergies (verified) Codeine phosphate   History: Past Medical History:  Diagnosis Date   Allergy    ANEMIA 09/25/2009   Diverticular disease 2013   Hemorrhoid    HYPERLIPIDEMIA  12/07/2007   Kidney stone    OSTEOPENIA 12/07/2007   PERIMENOPAUSAL SYNDROME 12/10/2008   Past Surgical History:  Procedure Laterality Date   COLONOSCOPY     CYSTOSCOPY     ?2000   FRACTURE SURGERY  2000   finger-pins placed. "hard to wake up from anesthesia" per pt   POLYPECTOMY     TONSILLECTOMY     Family History  Problem Relation Age of Onset   Cancer Mother    Cancer Father    Colon cancer Father 86   Colon polyps Neg Hx    Esophageal cancer Neg Hx    Rectal cancer Neg Hx    Stomach cancer Neg Hx    Social History   Socioeconomic History   Marital status: Divorced    Spouse name: Not on file   Number of children: Not on file   Years of education: Not on file   Highest education level: Not on file  Occupational History   Not on file  Tobacco Use   Smoking status: Never   Smokeless tobacco: Never  Vaping Use   Vaping status: Never Used  Substance and Sexual Activity   Alcohol use: Yes    Comment: rare   Drug use: No   Sexual activity: Not Currently  Other Topics Concern   Not on file  Social History Narrative   retired   Son and daughter-  both live locally- has a 71 yr old granddaughter   Divorced- 1998, Engaged (fiance lives in [place])   Social Drivers of Corporate investment banker Strain: Low Risk  (05/31/2023)   Overall Financial Resource Strain (CARDIA)    Difficulty of Paying Living Expenses: Not hard at all  Food Insecurity: No Food Insecurity (05/31/2023)   Hunger Vital Sign    Worried About Running Out of Food in the Last Year: Never true    Ran Out of Food in the Last Year: Never true  Transportation Needs: No Transportation Needs (05/31/2023)   PRAPARE - Administrator, Civil Service (Medical): No    Lack of Transportation (Non-Medical): No  Physical Activity: Inactive (05/31/2023)   Exercise Vital Sign    Days of Exercise per Week: 0 days    Minutes of Exercise per Session: 0 min  Stress: No Stress Concern Present (05/31/2023)    [employer] of Occupational Health - Occupational Stress Questionnaire    Feeling of Stress : Not at all  Social Connections: Moderately Integrated (05/31/2023)   Social Connection and Isolation Panel [NHANES]    Frequency of Communication with Friends and Family: More than three times a week    Frequency of Social Gatherings with Friends and Family: More than three times a week    Attends Religious Services: More than 4 times per year    Active Member of [employer] or Organizations: No    Attends Engineer, structural: Never    Marital Status: Living with partner    Tobacco Counseling Counseling given: Not Answered   Clinical Intake:  Pre-visit preparation completed: Yes  Pain : No/denies pain  BMI - recorded: 25.66 Nutritional Status: BMI 25 -29 Overweight Nutritional Risks: None Diabetes: No  How often do you need to have someone help you when you read instructions, pamphlets, or other written materials from your doctor or pharmacy?: 1 - Never  Interpreter Needed?: No  Information entered by :: Donne Anon, CMA   Activities of Daily Living    05/31/2023    9:41 AM  In your present state of health, do you have any difficulty performing the following activities:  Hearing? 0  Vision? 0  Difficulty concentrating or making decisions? 1  Walking or climbing stairs? 0  Dressing or bathing? 0  Doing errands, shopping? 0  Preparing Food and eating ? N  Using the Toilet? N  In the past six months, have you accidently leaked urine? N  Do you have problems with loss of bowel control? N  Managing your Medications? N  Managing your Finances? N  Housekeeping or managing your Housekeeping? N    Patient Care Team: Sandford Craze, NP as PCP - General (Internal Medicine)  Indicate any recent Medical Services you may have received from other than [facility] providers in the past year (date may be approximate).     Assessment:   This is a routine wellness examination  for [name].  Hearing/Vision screen No results found.   Goals Addressed   None    Depression Screen    05/31/2023   10:01 AM 05/27/2022    9:14 AM 04/23/2022    1:13 PM 05/25/2021    9:22 AM 10/07/2020   12:48 PM 06/11/2019   12:27 PM 02/06/2016    1:18 PM  PHQ 2/9 Scores  PHQ - 2 Score 0 0 0 1 0 0 0    Fall Risk    05/31/2023    9:47 AM  05/27/2022    9:05 AM 04/23/2022    1:12 PM 05/25/2021    9:16 AM 10/07/2020   12:48 PM  Fall Risk   Falls in the past year? 0 0 1 0 1  Number falls in past yr: 0 0 0 0 0  Injury with Fall? 0 0 0 0 1  Risk for fall due to : No Fall Risks No Fall Risks History of fall(s)    Follow up Falls evaluation completed Falls evaluation completed Falls evaluation completed Falls prevention discussed     MEDICARE RISK AT HOME: Medicare Risk at Home Any stairs in or around the home?: Yes If so, are there any without handrails?: No Home free of loose throw rugs in walkways, pet beds, electrical cords, etc?: Yes Adequate lighting in your home to reduce risk of falls?: Yes Life alert?: No Use of a cane, walker or w/c?: No Grab bars in the bathroom?: No Shower chair or bench in shower?: No Elevated toilet seat or a handicapped toilet?: No  TIMED UP AND GO:  Was the test performed?  Yes  Length of time to ambulate 10 feet: 6 sec Gait steady and fast without use of assistive device    Cognitive Function:    10/15/2019    8:23 AM  MMSE - Mini Mental State Exam  Orientation to time 5  Orientation to Place 5  Registration 3  Attention/ Calculation 5  Recall 3  Language- name 2 objects 2  Language- repeat 1  Language- follow 3 step command 3  Language- read & follow direction 1  Write a sentence 1  Copy design 1  Total score 30        05/31/2023   10:02 AM 05/27/2022    9:19 AM  6CIT Screen  What Year? 0 points 0 points  What month? 0 points 0 points  What time? 0 points 0 points  Count back from 20 0 points 2 points  Months in reverse 0 points  0 points  Repeat phrase 2 points 2 points  Total Score 2 points 4 points    Immunizations Immunization History  Administered Date(s) Administered   Fluad Quad(high Dose 65+) 01/18/2019, 05/07/2022   Influenza Split 04/06/2011, 01/14/2012   Influenza Whole 04/02/2010   Influenza, High Dose Seasonal PF 02/07/2017, 02/08/2018, 01/30/2019   Influenza,inj,Quad PF,6+ Mos 12/30/2014, 02/06/2016   Influenza-Unspecified 03/19/2021   PFIZER(Purple Top)SARS-COV-2 Vaccination 07/09/2019, 08/01/2019   Pneumococcal Conjugate-13 02/07/2017   Pneumococcal Polysaccharide-23 06/11/2019   Tdap 12/17/2013   Zoster Recombinant(Shingrix) 06/11/2019, 10/15/2019    TDAP status: Up to date  Flu Vaccine status: Completed at today's visit  Pneumococcal vaccine status: Up to date  Covid-19 vaccine status: Information provided on how to obtain vaccines.   Qualifies for Shingles Vaccine? Yes   Zostavax completed No   Shingrix Completed?: Yes  Screening Tests Health Maintenance  Topic Date Due   Hepatitis C Screening  Never done   INFLUENZA VACCINE  11/18/2022   COVID-19 Vaccine (3 - 2024-25 season) 12/19/2022   MAMMOGRAM  05/19/2023   Medicare Annual Wellness (AWV)  05/28/2023   DTaP/Tdap/Td (2 - Td or Tdap) 12/18/2023   Colonoscopy  09/08/2027   Pneumonia Vaccine 47+ Years old  Completed   DEXA SCAN  Completed   Zoster Vaccines- Shingrix  Completed   HPV VACCINES  Aged Out    Health Maintenance  Health Maintenance Due  Topic Date Due   Hepatitis C Screening  Never done   INFLUENZA VACCINE  11/18/2022   COVID-19 Vaccine (3 - 2024-25 season) 12/19/2022   MAMMOGRAM  05/19/2023   Medicare Annual Wellness (AWV)  05/28/2023    Colorectal cancer screening: Type of screening: Colonoscopy. Completed 09/08/22. Repeat every 5 years  Mammogram status: Completed 05/18/22. Repeat every year  Bone Density status: Completed 10/04/22. Results reflect: Bone density results: OSTEOPENIA. Repeat every 2  years.  Lung Cancer Screening: (Low Dose CT Chest recommended if Age 72-80 years, 20 pack-year currently smoking OR have quit w/in 15years.) does not qualify.   Additional Screening:  Hepatitis C Screening: does qualify; Completed N/a  Vision Screening: Recommended annual ophthalmology exams for early detection of glaucoma and other disorders of the eye. Is the patient up to date with their annual eye exam?  Yes  Who is the provider or what is the name of the office in which the patient attends annual eye exams? Dr. Emily Filbert If pt is not established with a provider, would they like to be referred to a provider to establish care? No .   Dental Screening: Recommended annual dental exams for proper oral hygiene  Diabetic Foot Exam: N/a  Community Resource Referral / Chronic Care Management: CRR required this visit?  No   CCM required this visit?  No     Plan:     I have personally reviewed and noted the following in the patient's chart:   Medical and social history Use of alcohol, tobacco or illicit drugs  Current medications and supplements including opioid prescriptions. Patient is not currently taking opioid prescriptions. Functional ability and status Nutritional status Physical activity Advanced directives List of other physicians Hospitalizations, surgeries, and ER visits in previous 12 months Vitals Screenings to include cognitive, depression, and falls Referrals and appointments  In addition, I have reviewed and discussed with patient certain preventive protocols, quality metrics, and best practice recommendations. A written personalized care plan for preventive services as well as general preventive health recommendations were provided to patient.     Donne Anon, CMA   05/31/2023   After Visit Summary: (In Person-Declined) Patient declined AVS at this time.  Nurse Notes: None

## 2023-05-31 NOTE — Addendum Note (Signed)
Addended by: Donne Anon L on: 05/31/2023 10:14 AM   Modules accepted: Level of Service

## 2023-05-31 NOTE — Patient Instructions (Signed)
Megan Lutz , Thank you for taking time to come for your Medicare Wellness Visit. I appreciate your ongoing commitment to your health goals. Please review the following plan we discussed and let me know if I can assist you in the future.     This is a list of the screening recommended for you and due dates:  Health Maintenance  Topic Date Due   Hepatitis C Screening  Never done   Flu Shot  11/18/2022   COVID-19 Vaccine (3 - 2024-25 season) 12/19/2022   Mammogram  05/19/2023   DTaP/Tdap/Td vaccine (2 - Td or Tdap) 12/18/2023   Medicare Annual Wellness Visit  05/30/2024   Colon Cancer Screening  09/08/2027   Pneumonia Vaccine  Completed   DEXA scan (bone density measurement)  Completed   Zoster (Shingles) Vaccine  Completed   HPV Vaccine  Aged Out    Next appointment: Follow up in one year for your annual wellness visit.   Preventive Care 75 Years and Older, Female Preventive care refers to lifestyle choices and visits with your health care provider that can promote health and wellness. What does preventive care include? A yearly physical exam. This is also called an annual well check. Dental exams once or twice a year. Routine eye exams. Ask your health care provider how often you should have your eyes checked. Personal lifestyle choices, including: Daily care of your teeth and gums. Regular physical activity. Eating a healthy diet. Avoiding tobacco and drug use. Limiting alcohol use. Practicing safe sex. Taking low-dose aspirin every day. Taking vitamin and mineral supplements as recommended by your health care provider. What happens during an annual well check? The services and screenings done by your health care provider during your annual well check will depend on your age, overall health, lifestyle risk factors, and family history of disease. Counseling  Your health care provider may ask you questions about your: Alcohol use. Tobacco use. Drug use. Emotional  well-being. Home and relationship well-being. Sexual activity. Eating habits. History of falls. Memory and ability to understand (cognition). Work and work Astronomer. Reproductive health. Screening  You may have the following tests or measurements: Height, weight, and BMI. Blood pressure. Lipid and cholesterol levels. These may be checked every 5 years, or more frequently if you are over 22 years old. Skin check. Lung cancer screening. You may have this screening every year starting at age 23 if you have a 30-pack-year history of smoking and currently smoke or have quit within the past 15 years. Fecal occult blood test (FOBT) of the stool. You may have this test every year starting at age 84. Flexible sigmoidoscopy or colonoscopy. You may have a sigmoidoscopy every 5 years or a colonoscopy every 10 years starting at age 41. Hepatitis C blood test. Hepatitis B blood test. Sexually transmitted disease (STD) testing. Diabetes screening. This is done by checking your blood sugar (glucose) after you have not eaten for a while (fasting). You may have this done every 1-3 years. Bone density scan. This is done to screen for osteoporosis. You may have this done starting at age 85. Mammogram. This may be done every 1-2 years. Talk to your health care provider about how often you should have regular mammograms. Talk with your health care provider about your test results, treatment options, and if necessary, the need for more tests. Vaccines  Your health care provider may recommend certain vaccines, such as: Influenza vaccine. This is recommended every year. Tetanus, diphtheria, and acellular pertussis (Tdap, Td)  vaccine. You may need a Td booster every 10 years. Zoster vaccine. You may need this after age 54. Pneumococcal 13-valent conjugate (PCV13) vaccine. One dose is recommended after age 88. Pneumococcal polysaccharide (PPSV23) vaccine. One dose is recommended after age 60. Talk to your  health care provider about which screenings and vaccines you need and how often you need them. This information is not intended to replace advice given to you by your health care provider. Make sure you discuss any questions you have with your health care provider. Document Released: 05/02/2015 Document Revised: 12/24/2015 Document Reviewed: 02/04/2015 Elsevier Interactive Patient Education  2017 [employer].  Fall Prevention in the Home Falls can cause injuries. They can happen to people of all ages. There are many things you can do to make your home safe and to help prevent falls. What can I do on the outside of my home? Regularly fix the edges of walkways and driveways and fix any cracks. Remove anything that might make you trip as you walk through a door, such as a raised step or threshold. Trim any bushes or trees on the path to your home. Use bright outdoor lighting. Clear any walking paths of anything that might make someone trip, such as rocks or tools. Regularly check to see if handrails are loose or broken. Make sure that both sides of any steps have handrails. Any raised decks and porches should have guardrails on the edges. Have any leaves, snow, or ice cleared regularly. Use sand or salt on walking paths during winter. Clean up any spills in your garage right away. This includes oil or grease spills. What can I do in the bathroom? Use night lights. Install grab bars by the toilet and in the tub and shower. Do not use towel bars as grab bars. Use non-skid mats or decals in the tub or shower. If you need to sit down in the shower, use a plastic, non-slip stool. Keep the floor dry. Clean up any water that spills on the floor as soon as it happens. Remove soap buildup in the tub or shower regularly. Attach bath mats securely with double-sided non-slip rug tape. Do not have throw rugs and other things on the floor that can make you trip. What can I do in the bedroom? Use night  lights. Make sure that you have a light by your bed that is easy to reach. Do not use any sheets or blankets that are too big for your bed. They should not hang down onto the floor. Have a firm chair that has side arms. You can use this for support while you get dressed. Do not have throw rugs and other things on the floor that can make you trip. What can I do in the kitchen? Clean up any spills right away. Avoid walking on wet floors. Keep items that you use a lot in easy-to-reach places. If you need to reach something above you, use a strong step stool that has a grab bar. Keep electrical cords out of the way. Do not use floor polish or wax that makes floors slippery. If you must use wax, use non-skid floor wax. Do not have throw rugs and other things on the floor that can make you trip. What can I do with my stairs? Do not leave any items on the stairs. Make sure that there are handrails on both sides of the stairs and use them. Fix handrails that are broken or loose. Make sure that handrails are as long  as the stairways. Check any carpeting to make sure that it is firmly attached to the stairs. Fix any carpet that is loose or worn. Avoid having throw rugs at the top or bottom of the stairs. If you do have throw rugs, attach them to the floor with carpet tape. Make sure that you have a light switch at the top of the stairs and the bottom of the stairs. If you do not have them, ask someone to add them for you. What else can I do to help prevent falls? Wear shoes that: Do not have high heels. Have rubber bottoms. Are comfortable and fit you well. Are closed at the toe. Do not wear sandals. If you use a stepladder: Make sure that it is fully opened. Do not climb a closed stepladder. Make sure that both sides of the stepladder are locked into place. Ask someone to hold it for you, if possible. Clearly mark and make sure that you can see: Any grab bars or handrails. First and last  steps. Where the edge of each step is. Use tools that help you move around (mobility aids) if they are needed. These include: Canes. Walkers. Scooters. Crutches. Turn on the lights when you go into a dark area. Replace any light bulbs as soon as they burn out. Set up your furniture so you have a clear path. Avoid moving your furniture around. If any of your floors are uneven, fix them. If there are any pets around you, be aware of where they are. Review your medicines with your doctor. Some medicines can make you feel dizzy. This can increase your chance of falling. Ask your doctor what other things that you can do to help prevent falls. This information is not intended to replace advice given to you by your health care provider. Make sure you discuss any questions you have with your health care provider. Document Released: 01/30/2009 Document Revised: 09/11/2015 Document Reviewed: 05/10/2014 Elsevier Interactive Patient Education  2017 [employer].

## 2023-07-12 DIAGNOSIS — D225 Melanocytic nevi of trunk: Secondary | ICD-10-CM | POA: Diagnosis not present

## 2023-07-12 DIAGNOSIS — D485 Neoplasm of uncertain behavior of skin: Secondary | ICD-10-CM | POA: Diagnosis not present

## 2023-07-12 DIAGNOSIS — L570 Actinic keratosis: Secondary | ICD-10-CM | POA: Diagnosis not present

## 2023-07-12 DIAGNOSIS — L821 Other seborrheic keratosis: Secondary | ICD-10-CM | POA: Diagnosis not present

## 2023-07-12 DIAGNOSIS — L812 Freckles: Secondary | ICD-10-CM | POA: Diagnosis not present

## 2023-10-10 ENCOUNTER — Ambulatory Visit (INDEPENDENT_AMBULATORY_CARE_PROVIDER_SITE_OTHER): Admitting: Medical

## 2023-10-10 ENCOUNTER — Other Ambulatory Visit: Payer: Self-pay | Admitting: *Deleted

## 2023-10-10 ENCOUNTER — Ambulatory Visit (HOSPITAL_BASED_OUTPATIENT_CLINIC_OR_DEPARTMENT_OTHER)
Admission: RE | Admit: 2023-10-10 | Discharge: 2023-10-10 | Disposition: A | Discharge status: Home / Self Care | Source: Ambulatory Visit | Attending: Medical | Admitting: Medical

## 2023-10-10 VITALS — BP 126/80 | HR 63 | Temp 98.1°F | Resp 12 | Ht 65.0 in | Wt 156.6 lb

## 2023-10-10 DIAGNOSIS — M25562 Pain in left knee: Secondary | ICD-10-CM | POA: Diagnosis not present

## 2023-10-10 DIAGNOSIS — G8929 Other chronic pain: Secondary | ICD-10-CM

## 2023-10-10 DIAGNOSIS — H60502 Unspecified acute noninfective otitis externa, left ear: Secondary | ICD-10-CM

## 2023-10-10 DIAGNOSIS — M1712 Unilateral primary osteoarthritis, left knee: Secondary | ICD-10-CM | POA: Diagnosis not present

## 2023-10-10 MED ORDER — NEOMYCIN-POLYMYXIN-HC 3.5-10000-1 OT SOLN: 3.0000 [drp] | Freq: Four times a day (QID) | OTIC | 0 refills | Status: DC

## 2023-10-10 MED ORDER — ALENDRONATE SODIUM 70 MG PO TABS: 70.0000 mg | ORAL_TABLET | ORAL | 3 refills | Status: AC

## 2023-10-10 NOTE — Patient Instructions (Addendum)
 Otitis Externa Recurrent otitis externa in the right ear, responsive to ear drops in the past. No ear canal swelling or significant cerumen. Tympanic membrane normal. No indication for oral antibiotics. - Prescribe ear drops. - Instruct to start ear drops today and update on progress by Thursday via phone call. - Advise to report any deep ear pain for possible oral antibiotics. - Schedule follow-up if residual pain persists 7-10 days after starting treatment.  Bell's Palsy(remote history).  History of right-sided Bell's palsy, previously treated with antivirals. Occasional shooting pains reported. - Educated on signs of Bell's palsy recurrence. - Advised immediate medical attention and antiviral treatment within 24-48 hours if symptoms recur.  Update call on Thursday to see how you are doing. Also if any tragus pain by one will need follow up in office  Also end of visit noted left knee pain intermittently with crepitus for 6 months. -will get xray of left knee today. -after review decide on possible ortho referral vs sport med referral.

## 2023-10-10 NOTE — Progress Notes (Signed)
 Subjective:    Patient ID: Megan Lutz, female    DOB: 1950/09/02, 73 y.o.   MRN: [id number]  HPI   Megan Lutz is a 73 year old female with a history of Bell's palsy who presents with left ear pain and tenderness.  She experiences ear pain and tenderness in the same ear where she previously had Bell's palsy. The pain is described as 'real tender' with occasional shooting pain when tragus touched. The pain has been recurring for over a year, with episodes approximately every six months. Each episode has responded to ear drops each time, although the relief is not complete, and she sometimes discontinues the drops prematurely.  No recent swimming, sinus pain, or congestion. She occasionally uses Q-tips to clean her ears but is careful not to insert them deeply.  Her past medical history includes Bell's palsy, which occurred on the same side as the current ear issue. During the Bell's palsy episode, she experienced shooting pains and facial asymmetry, which resolved with treatment. She recalls the onset of Bell's palsy as a frightening experience, with symptoms including facial drooping and inability to close her eye completely. No current recurrence of such symptoms/signs.  In the review of symptoms, no pain behind the ear, sinus pain, or congestion. The ear pain is localized to the tragus, which is tender to touch.    Review of Systems  Constitutional:  Negative for chills and fatigue.  HENT:  Positive for ear pain.   Respiratory:  Negative for cough, chest tightness and wheezing.   Cardiovascular:  Negative for chest pain and palpitations.  Gastrointestinal:  Negative for abdominal pain.  Genitourinary:  Negative for dysuria and frequency.  Musculoskeletal:  Negative for back pain and neck pain.  Skin:  Negative for rash.  Neurological:  Negative for dizziness and light-headedness.  Hematological:  Negative for adenopathy.  Psychiatric/Behavioral:  Negative for  behavioral problems, decreased concentration and dysphoric mood.     Past Medical History:  Diagnosis Date   Allergy    ANEMIA 09/25/2009   Diverticular disease 2013   Hemorrhoid    HYPERLIPIDEMIA 12/07/2007   Kidney stone    OSTEOPENIA 12/07/2007   PERIMENOPAUSAL SYNDROME 12/10/2008     Social History   Socioeconomic History   Marital status: Divorced    Spouse name: Not on file   Number of children: Not on file   Years of education: Not on file   Highest education level: Not on file  Occupational History   Not on file  Tobacco Use   Smoking status: Never   Smokeless tobacco: Never  Vaping Use   Vaping status: Never Used  Substance and Sexual Activity   Alcohol use: Yes    Comment: rare   Drug use: No   Sexual activity: Not Currently  Other Topics Concern   Not on file  Social History Narrative   retired   Son and daughter- both live locally- has a 53 yr old granddaughter   Divorced- 1998, Development worker, 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[place])   Social Drivers of Corporate investment banker Strain: Low Risk  (05/31/2023)   Overall Financial Resource Strain (CARDIA)    Difficulty of Paying Living Expenses: Not hard at all  Food Insecurity: No Food Insecurity (05/31/2023)   Hunger Vital Sign    Worried About Running Out of Food in the Last Year: Never true    Ran Out of Food in the Last Year: Never true  Transportation Needs: No Transportation Needs (  05/31/2023)   PRAPARE - Administrator, Civil Service (Medical): No    Lack of Transportation (Non-Medical): No  Physical Activity: Inactive (05/31/2023)   Exercise Vital Sign    Days of Exercise per Week: 0 days    Minutes of Exercise per Session: 0 min  Stress: No Stress Concern Present (05/31/2023)   [employer] of Occupational Health - Occupational Stress Questionnaire    Feeling of Stress : Not at all  Social Connections: Moderately Integrated (05/31/2023)   Social Connection and Isolation Panel    Frequency of  Communication with Friends and Family: More than three times a week    Frequency of Social Gatherings with Friends and Family: More than three times a week    Attends Religious Services: More than 4 times per year    Active Member of [employer] or Organizations: No    Attends Banker Meetings: Never    Marital Status: Living with partner  Intimate Partner Violence: Not At Risk (05/31/2023)   Humiliation, Afraid, Rape, and Kick questionnaire    Fear of Current or Ex-Partner: No    Emotionally Abused: No    Physically Abused: No    Sexually Abused: No    Past Surgical History:  Procedure Laterality Date   COLONOSCOPY     CYSTOSCOPY     ?2000   FRACTURE SURGERY  2000   finger-pins placed. hard to wake up from anesthesia per pt   POLYPECTOMY     TONSILLECTOMY      Family History  Problem Relation Age of Onset   Cancer Mother    Cancer Father    Colon cancer Father 76   Colon polyps Neg Hx    Esophageal cancer Neg Hx    Rectal cancer Neg Hx    Stomach cancer Neg Hx     Allergies  Allergen Reactions   Codeine Phosphate Nausea And Vomiting    Current Outpatient Medications on File Prior to Visit  Medication Sig Dispense Refill   aspirin 81 MG tablet Take 81 mg by mouth daily.       Calcium  Carbonate-Vit D-Min (CALTRATE 600+D PLUS MINERALS) 600-800 MG-UNIT TABS Take 1 tablet by mouth 2 (two) times daily.     Lutein -Zeaxanthin 15-0.7 MG CAPS Take 1 capsule by mouth daily.  0   rosuvastatin  (CRESTOR ) 10 MG tablet Take 1 tablet (10 mg total) by mouth daily. 90 tablet 0   Vitamin D , Ergocalciferol , (DRISDOL ) 1.25 MG (50000 UNIT) CAPS capsule Take 1 capsule (50,000 Units total) by mouth every 7 (seven) days. 12 capsule 0   No current facility-administered medications on file prior to visit.    BP 126/80 (BP Location: Left Arm, Patient Position: Sitting, Cuff Size: Normal)   Pulse 63   Temp 98.1 F (36.7 C) (Oral)   Resp 12   Ht 5' 5 (1.651 m)   Wt 156 lb 9.6 oz  (71 kg)   SpO2 97%   BMI 26.06 kg/m        Objective:   Physical Exam  General Mental Status- Alert. General Appearance- Not in acute distress.    Neck No JVD.  Chest and Lung Exam Auscultation: Breath Sounds:-CTA  Cardiovascular Auscultation:Rythm- RRR Murmurs & Other Heart Sounds:Auscultation of the heart reveals- No Murmurs.  Neurologic Cranial Nerve exam:- CN III-XII intact(No nystagmus), symmetric smile. Strength:- 5/5 equal and symmetric strength both upper and lower extremities.  -normal neurologic exam(no bells palsy type presentation)   Heent- left tragus very  tender to palpation. No posterir auricle pain on papation. Ear appears normal outside. Ear canal normal and normal tm though on insetion ear speculum has tenderness. No wax present.  Rt side ear, canal and tm normal.   Left knee- not swollen, good rom but crepitus    Assessment & Plan:   Patient Instructions  Otitis Externa Recurrent otitis externa in the right ear, responsive to ear drops in the past. No ear canal swelling or significant cerumen. Tympanic membrane normal. No indication for oral antibiotics. - Prescribe ear drops. - Instruct to start ear drops today and update on progress by Thursday via phone call. - Advise to report any deep ear pain for possible oral antibiotics. - Schedule follow-up if residual pain persists 7-10 days after starting treatment.  Bell's Palsy(remote history).  History of right-sided Bell's palsy, previously treated with antivirals. Occasional shooting pains reported. - Educated on signs of Bell's palsy recurrence. - Advised immediate medical attention and antiviral treatment within 24-48 hours if symptoms recur.  Update call on Thursday to see how you are doing. Also if any tragus pain by one will need follow up in office  Also end of visit noted left knee pain intermittently with crepitus for 6 months. -will get xray of left knee today. -after review decide on  possible ortho referral vs sport med referral.   Tyria Springer, PA-C

## 2023-10-18 ENCOUNTER — Ambulatory Visit (INDEPENDENT_AMBULATORY_CARE_PROVIDER_SITE_OTHER): Admitting: Family

## 2023-10-18 ENCOUNTER — Ambulatory Visit: Payer: Self-pay | Admitting: Medical

## 2023-10-18 VITALS — BP 125/74 | HR 74 | Temp 97.8°F | Resp 16 | Ht 65.0 in | Wt 154.0 lb

## 2023-10-18 DIAGNOSIS — R413 Other amnesia: Secondary | ICD-10-CM | POA: Insufficient documentation

## 2023-10-18 DIAGNOSIS — E559 Vitamin D deficiency, unspecified: Secondary | ICD-10-CM

## 2023-10-18 DIAGNOSIS — E785 Hyperlipidemia, unspecified: Secondary | ICD-10-CM

## 2023-10-18 DIAGNOSIS — Z1231 Encounter for screening mammogram for malignant neoplasm of breast: Secondary | ICD-10-CM | POA: Diagnosis not present

## 2023-10-18 DIAGNOSIS — F419 Anxiety disorder, unspecified: Secondary | ICD-10-CM | POA: Insufficient documentation

## 2023-10-18 MED ORDER — ROSUVASTATIN CALCIUM 10 MG PO TABS: 10.0000 mg | ORAL_TABLET | Freq: Every day | ORAL | 0 refills | Status: DC

## 2023-10-18 NOTE — Addendum Note (Signed)
 Addended by: DORINA DALLAS HERO on: 10/18/2023 05:03 AM   Modules accepted: Orders

## 2023-10-18 NOTE — Assessment & Plan Note (Signed)
 Check labs as ordered. Refer to Neurology for formal memory evaluation.

## 2023-10-18 NOTE — Assessment & Plan Note (Signed)
 Mild symptoms. I wonder if her memory loss is contributing to her feelings of anxiety. Monitor.

## 2023-10-18 NOTE — Progress Notes (Signed)
 Called patient, no answer or vm set up

## 2023-10-18 NOTE — Assessment & Plan Note (Signed)
 Inconsistent use of crestor . Update lipid panel.

## 2023-10-18 NOTE — Progress Notes (Signed)
 Subjective:     Patient ID: Megan Lutz, female    DOB: 03-22-1951, 73 y.o.   MRN: [id number]  Chief Complaint  Patient presents with   Hyperlipidemia    Here for follow up   Vitami D deficiency    Here for follow up   Anxiety    Patient complains of increased anxiety    Hyperlipidemia  Anxiety      Discussed the use of AI scribe software for clinical note transcription with the patient, who gave verbal consent to proceed.  History of Present Illness   Megan Lutz is a 73 year old female who presents with anxiety and memory concerns.  She experiences anxiety without a clear trigger, exacerbated by recent events such as a funeral and her son's father-in-law's liver transplant. She feels unsettled and nervous but not overly worried or restless. She denies hopelessness or trouble relaxing and reports sleeping well. Her anxiety does not overpower her daily life.  She is concerned about occasional forgetfulness and a decline in short-term memory sharpness. Her husband is present via phone and notes concern about her memory. Stating that he sometimes says things 4-5 times in a day to her and she still forgets. She has not consulted a neurologist for this issue.  She takes Fosamax  inconsistently with calcium  supplements and completed a high-dose vitamin D  course over a year ago but is not currently taking vitamin D . She takes rosuvastatin  for cholesterol but sometimes skips doses. She feels tired or 'off' at times, attributing this to general worry.    Health Maintenance Due  Topic Date Due   Hepatitis C Screening  Never done   COVID-19 Vaccine (3 - 2024-25 season) 12/19/2022   MAMMOGRAM  05/19/2023    Past Medical History:  Diagnosis Date   Allergy    ANEMIA 09/25/2009   Diverticular disease 2013   Hemorrhoid    HYPERLIPIDEMIA 12/07/2007   Kidney stone    OSTEOPENIA 12/07/2007   PERIMENOPAUSAL SYNDROME 12/10/2008    Past Surgical History:  Procedure  Laterality Date   COLONOSCOPY     CYSTOSCOPY     ?2000   FRACTURE SURGERY  2000   finger-pins placed. hard to wake up from anesthesia per pt   POLYPECTOMY     TONSILLECTOMY      Family History  Problem Relation Age of Onset   Cancer Mother    Cancer Father    Colon cancer Father 75   Colon polyps Neg Hx    Esophageal cancer Neg Hx    Rectal cancer Neg Hx    Stomach cancer Neg Hx     Social History   Socioeconomic History   Marital status: Divorced    Spouse name: Not on file   Number of children: Not on file   Years of education: Not on file   Highest education level: Not on file  Occupational History   Not on file  Tobacco Use   Smoking status: Never   Smokeless tobacco: Never  Vaping Use   Vaping status: Never Used  Substance and Sexual Activity   Alcohol use: Yes    Comment: rare   Drug use: No   Sexual activity: Not Currently  Other Topics Concern   Not on file  Social History Narrative   retired   Son and daughter- both live locally- has a 21 yr old granddaughter   Divorced- 1998, Development worker, 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[place])   Social Drivers of Dispensing optician  Resource Strain: Low Risk  (05/31/2023)   Overall Financial Resource Strain (CARDIA)    Difficulty of Paying Living Expenses: Not hard at all  Food Insecurity: No Food Insecurity (05/31/2023)   Hunger Vital Sign    Worried About Running Out of Food in the Last Year: Never true    Ran Out of Food in the Last Year: Never true  Transportation Needs: No Transportation Needs (05/31/2023)   PRAPARE - Administrator, Civil Service (Medical): No    Lack of Transportation (Non-Medical): No  Physical Activity: Inactive (05/31/2023)   Exercise Vital Sign    Days of Exercise per Week: 0 days    Minutes of Exercise per Session: 0 min  Stress: No Stress Concern Present (05/31/2023)   [employer] of Occupational Health - Occupational Stress Questionnaire    Feeling of Stress : Not at all  Social  Connections: Moderately Integrated (05/31/2023)   Social Connection and Isolation Panel    Frequency of Communication with Friends and Family: More than three times a week    Frequency of Social Gatherings with Friends and Family: More than three times a week    Attends Religious Services: More than 4 times per year    Active Member of [employer] or Organizations: No    Attends Banker Meetings: Never    Marital Status: Living with partner  Intimate Partner Violence: Not At Risk (05/31/2023)   Humiliation, Afraid, Rape, and Kick questionnaire    Fear of Current or Ex-Partner: No    Emotionally Abused: No    Physically Abused: No    Sexually Abused: No    Outpatient Medications Prior to Visit  Medication Sig Dispense Refill   alendronate  (FOSAMAX ) 70 MG tablet Take 1 tablet (70 mg total) by mouth every 7 (seven) days. Take with a full glass of water on an empty stomach. 12 tablet 3   aspirin 81 MG tablet Take 81 mg by mouth daily.       Calcium  Carbonate-Vit D-Min (CALTRATE 600+D PLUS MINERALS) 600-800 MG-UNIT TABS Take 1 tablet by mouth 2 (two) times daily.     Lutein -Zeaxanthin 15-0.7 MG CAPS Take 1 capsule by mouth daily.  0   neomycin -polymyxin-hydrocortisone (CORTISPORIN) OTIC solution Place 3 drops into the left ear 4 (four) times daily. 10 mL 0   rosuvastatin  (CRESTOR ) 10 MG tablet Take 1 tablet (10 mg total) by mouth daily. 90 tablet 0   Vitamin D , Ergocalciferol , (DRISDOL ) 1.25 MG (50000 UNIT) CAPS capsule Take 1 capsule (50,000 Units total) by mouth every 7 (seven) days. 12 capsule 0   No facility-administered medications prior to visit.    Allergies  Allergen Reactions   Codeine Phosphate Nausea And Vomiting    ROS See HPI    Objective:    Physical Exam Constitutional:      General: She is not in acute distress.    Appearance: Normal appearance. She is well-developed.  HENT:     Head: Normocephalic and atraumatic.     Right Ear: External ear normal.      Left Ear: External ear normal.   Eyes:     General: No scleral icterus.  Neck:     Thyroid : No thyromegaly.   Cardiovascular:     Rate and Rhythm: Normal rate and regular rhythm.     Heart sounds: Normal heart sounds. No murmur heard. Pulmonary:     Effort: Pulmonary effort is normal. No respiratory distress.     Breath sounds: Normal  breath sounds. No wheezing.   Musculoskeletal:     Cervical back: Neck supple.   Skin:    General: Skin is warm and dry.   Neurological:     Mental Status: She is alert and oriented to person, place, and time.   Psychiatric:        Mood and Affect: Mood normal.        Behavior: Behavior normal.        Thought Content: Thought content normal.        Judgment: Judgment normal.      BP 125/74 (BP Location: Right Arm, Patient Position: Sitting, Cuff Size: Normal)   Pulse 74   Temp 97.8 F (36.6 C) (Oral)   Resp 16   Ht 5' 5 (1.651 m)   Wt 154 lb (69.9 kg)   SpO2 97%   BMI 25.63 kg/m  Wt Readings from Last 3 Encounters:  10/18/23 154 lb (69.9 kg)  10/10/23 156 lb 9.6 oz (71 kg)  05/31/23 154 lb 3.2 oz (69.9 kg)       Assessment & Plan:   Problem List Items Addressed This Visit       Unprioritized   Vitamin D  deficiency - Primary   Not currently on supplement. Recheck level.      Relevant Orders   Vitamin D  (25 hydroxy)   Memory loss   Check labs as ordered. Refer to Neurology for formal memory evaluation.       Relevant Orders   B12 and Folate Panel   RPR   TSH   Ambulatory referral to Neurology   Hyperlipidemia   Inconsistent use of crestor . Update lipid panel.       Relevant Medications   rosuvastatin  (CRESTOR ) 10 MG tablet   Other Relevant Orders   Lipid panel   Comp Met (CMET)   Anxiety   Mild symptoms. I wonder if her memory loss is contributing to her feelings of anxiety. Monitor.       Other Visit Diagnoses       Breast cancer screening by mammogram       Relevant Orders   MM 3D SCREENING  MAMMOGRAM BILATERAL BREAST       I have discontinued Reena FALCON. Chappuis's Vitamin D  (Ergocalciferol ) and neomycin -polymyxin-hydrocortisone. I am also having her maintain her aspirin, Lutein -Zeaxanthin, Caltrate 600+D Plus Minerals, alendronate , and rosuvastatin .  Meds ordered this encounter  Medications   rosuvastatin  (CRESTOR ) 10 MG tablet    Sig: Take 1 tablet (10 mg total) by mouth daily.    Dispense:  90 tablet    Refill:  0    Supervising Provider:   DOMENICA BLACKBIRD A [[id number]]

## 2023-10-18 NOTE — Patient Instructions (Signed)
 VISIT SUMMARY:  Today, we discussed your concerns about anxiety and memory issues. We also reviewed your current medications and supplements, including those for osteoporosis, cholesterol, and vitamin D  deficiency.  YOUR PLAN:  MEMORY LOSS: You have been experiencing some short-term memory issues. -We will order blood tests to check for any medical causes of your memory loss. -You will be referred to a neurologist for a formal memory evaluation and potential treatment.  ANXIETY: You have been feeling anxious due to recent stressful events. -Monitor your anxiety symptoms. -If your anxiety worsens, consider seeking counseling. We can provide information if needed.  OSTEOPOROSIS: You are taking Fosamax  for osteoporosis but have been inconsistent with your calcium  supplements. -Make sure to take your calcium  supplements consistently.  HYPERLIPIDEMIA: You are taking rosuvastatin  for cholesterol but sometimes skip doses. -We will send a refill for rosuvastatin  to your pharmacy. -Use a pill organizer to help you take your medication regularly.  VITAMIN D  DEFICIENCY: You completed a high-dose vitamin D  regimen over a year ago but are not currently taking any supplements. -We will check your vitamin D  levels. -Based on the results, you may need to start taking daily over-the-counter vitamin D  supplements.

## 2023-10-18 NOTE — Assessment & Plan Note (Signed)
Not currently on supplement. Recheck level.

## 2023-10-26 ENCOUNTER — Ambulatory Visit: Admitting: Sports Medicine

## 2023-10-26 NOTE — Progress Notes (Unsigned)
    [name] D.[facility] [address] [place] [address] Phone: [phone]   Assessment and Plan:     There are no diagnoses linked to this encounter.  ***   Pertinent previous records reviewed include ***    Follow Up: ***     Subjective:   I, Megan Lutz, am serving as a Neurosurgeon for Doctor Morene Mace  Chief Complaint: left knee pain   HPI:   10/27/2023 Patient is a 73 year old female with left knee pain. Patient states   Relevant Historical Information: ***  Additional pertinent review of systems negative.   Current Outpatient Medications:    alendronate  (FOSAMAX ) 70 MG tablet, Take 1 tablet (70 mg total) by mouth every 7 (seven) days. Take with a full glass of water on an empty stomach., Disp: 12 tablet, Rfl: 3   aspirin 81 MG tablet, Take 81 mg by mouth daily.  , Disp: , Rfl:    Calcium  Carbonate-Vit D-Min (CALTRATE 600+D PLUS MINERALS) 600-800 MG-UNIT TABS, Take 1 tablet by mouth 2 (two) times daily., Disp: , Rfl:    Lutein -Zeaxanthin 15-0.7 MG CAPS, Take 1 capsule by mouth daily., Disp: , Rfl: 0   rosuvastatin  (CRESTOR ) 10 MG tablet, Take 1 tablet (10 mg total) by mouth daily., Disp: 90 tablet, Rfl: 0   Objective:     There were no vitals filed for this visit.    There is no height or weight on file to calculate BMI.    Physical Exam:    ***   Electronically signed by:  Odis Mace D.[facility] 7:43 AM 10/26/23

## 2023-10-27 ENCOUNTER — Ambulatory Visit: Admitting: Sports Medicine

## 2023-10-27 VITALS — BP 120/82 | HR 69 | Ht 65.0 in | Wt 157.0 lb

## 2023-10-27 DIAGNOSIS — G8929 Other chronic pain: Secondary | ICD-10-CM | POA: Diagnosis not present

## 2023-10-27 DIAGNOSIS — M1712 Unilateral primary osteoarthritis, left knee: Secondary | ICD-10-CM

## 2023-10-27 DIAGNOSIS — M25562 Pain in left knee: Secondary | ICD-10-CM

## 2023-10-27 NOTE — Patient Instructions (Addendum)
 Knee HEP  - Use meloxicam 15 mg daily as needed for pain.  Recommend limiting chronic NSAIDs to 1-2 doses per week to prevent long-term side effects. Tylenol  [phone] mg 2-3 times a day for pain relief  As needed follow up

## 2023-10-28 ENCOUNTER — Encounter: Payer: Self-pay | Admitting: Physician Assistant

## 2023-11-10 ENCOUNTER — Encounter (HOSPITAL_BASED_OUTPATIENT_CLINIC_OR_DEPARTMENT_OTHER): Payer: Self-pay

## 2023-11-10 ENCOUNTER — Ambulatory Visit (HOSPITAL_BASED_OUTPATIENT_CLINIC_OR_DEPARTMENT_OTHER)
Admission: RE | Admit: 2023-11-10 | Discharge: 2023-11-10 | Disposition: A | Discharge status: Home / Self Care | Source: Ambulatory Visit | Attending: Family | Admitting: Family

## 2023-11-10 DIAGNOSIS — Z1231 Encounter for screening mammogram for malignant neoplasm of breast: Secondary | ICD-10-CM | POA: Insufficient documentation

## 2023-11-16 ENCOUNTER — Encounter: Payer: Self-pay | Admitting: Physician Assistant

## 2023-11-16 ENCOUNTER — Other Ambulatory Visit

## 2023-11-16 ENCOUNTER — Ambulatory Visit

## 2023-11-16 ENCOUNTER — Ambulatory Visit: Admitting: Physician Assistant

## 2023-11-16 VITALS — BP 123/73 | HR 97 | Resp 20 | Ht 65.0 in | Wt 155.0 lb

## 2023-11-16 DIAGNOSIS — R413 Other amnesia: Secondary | ICD-10-CM

## 2023-11-16 LAB — VITAMIN B12: Vitamin B-12: 550 pg/mL (ref 200–1100)

## 2023-11-16 LAB — TSH: TSH: 1.64 m[IU]/L (ref 0.40–4.50)

## 2023-11-16 NOTE — Progress Notes (Signed)
 Assessment/Plan:   Megan Lutz is a very pleasant 73 y.o. year old RH female with a history of hyperlipidemia, vitamin D  deficiency, anemia, osteoporosis, anxiety, seen today for evaluation of memory loss. MoCA today is 14/30.  Etiology at this time is unknown, although there is concern for Alzheimer's disease.  Patient was very anxious during the visit, very tearful, so we discussed the role of anxiety in memory, and recommend to start antianxiety regimen as per PCP, as well as psychotherapy.  Once anxiety is better controlled, we discussed proceeding with neurocognitive testing as she demonstrates significant stress during the MoCA.  Patient is able to participate on ADLs.  Patient continues to drive without significant difficulties.     Memory Impairment of unclear etiology  MRI brain without contrast to assess for underlying structural abnormality and assess vascular load  Check B12, TSH If anxiety is better controlled, will consider neuropsych evaluation to determine other causes of memory loss such as anxiety, depression, sleep, etc. Continue to control mood as per PCP Recommend good control of cardiovascular risk factors [place] up in 6 months   Subjective:   The patient is accompanied by her significant other  who supplement  the history.   How long did patient have memory difficulties? For the last 1 year, maybe a little longer.  Reports some difficulty with short-term memory not as sharp , especially when remembering new information (boyfriend sometimes tells her the same thing 4 or 5 times and she still will forget), conversations and names. Anxiety triggers or makes memory worse.-she says  Long-term memory is good. repeats oneself?  Endorsed Disoriented when walking into a room?  Patient denies except occasionally not remembering what patient came to the room for   Leaving objects in unusual places? Denies.   Wandering behavior?  denies .  Any personality  changes?  She has been more anxious lately likely due to recent family events including funeral, family member undergoing liver transplant. Any history of depression?:  Denies.   Hallucinations or paranoia?  Denies   Seizures?  Denies    Any sleep changes?   Sleeps well. denies vivid dreams, REM behavior or sleepwalking.   Sleep apnea?  Denies   Any hygiene concerns?  Denies   Independent of bathing and dressing?  Endorsed  Does the patient needs help with medications? Patient is in charge, occasionally she may forget a dose, or sometimes she may overcheck things   Who is in charge of the finances? Patient is in charge.     Any changes in appetite?  Denies.  Does not drink enough water    Patient have trouble swallowing? Denies.   Does the patient cook? Not as much, now and then Any kitchen accidents such as leaving the stove on? Denies.   Any history of headaches?   Denies.   Chronic pain ? Denies.   Ambulates with difficulty?  Denies. R knee arthritis, pops every now and then, may limit mobility.   Recent falls or head injuries? Denies.   Vision changes? Denies.   Any stroke like symptoms? Denies.   Any tremors?   Denies.   Any anosmia?  Denies.   Any incontinence of urine? Denies.   Any bowel dysfunction? Denies.      Grandaughter lives with her, her partner visits her on a regular basis History of heavy alcohol intake? Denies.   History of heavy tobacco use? Denies.   Family history of dementia? Denies.  Does patient drive?  Yes, denies any issues      Past Medical History:  Diagnosis Date   Allergy    ANEMIA 09/25/2009   Diverticular disease 2013   Hemorrhoid    HYPERLIPIDEMIA 12/07/2007   Kidney stone    OSTEOPENIA 12/07/2007   PERIMENOPAUSAL SYNDROME 12/10/2008     Past Surgical History:  Procedure Laterality Date   COLONOSCOPY     CYSTOSCOPY     ?2000   FRACTURE SURGERY  2000   finger-pins placed. hard to wake up from anesthesia per pt   POLYPECTOMY      TONSILLECTOMY       Allergies  Allergen Reactions   Codeine Phosphate Nausea And Vomiting    Current Outpatient Medications  Medication Instructions   alendronate  (FOSAMAX ) 70 mg, Oral, Every 7 days, Take with a full glass of water on an empty stomach.   aspirin 81 mg, Daily   Calcium  Carbonate-Vit D-Min (CALTRATE 600+D PLUS MINERALS) 600-800 MG-UNIT TABS 1 tablet, Oral, 2 times daily   Lutein -Zeaxanthin 15-0.7 MG CAPS 1 capsule, Oral, Daily   rosuvastatin  (CRESTOR ) 10 mg, Oral, Daily     VITALS:   Vitals:   11/16/23 1310  BP: 123/73  Pulse: 97  Resp: 20  SpO2: 98%  Weight: 155 lb (70.3 kg)  Height: 5' 5 (1.651 m)         11/16/2023    2:00 PM  Montreal Cognitive Assessment   Visuospatial/ Executive (0/5) 2  Naming (0/3) 3  Attention: Read list of digits (0/2) 2  Attention: Read list of letters (0/1) 1  Attention: Serial 7 subtraction starting at 100 (0/3) 0  Language: Repeat phrase (0/2) 1  Language : Fluency (0/1) 0  Abstraction (0/2) 2  Delayed Recall (0/5) 1  Orientation (0/6) 2  Total 14  Adjusted Score (based on education) 14       10/15/2019    8:23 AM  MMSE - Mini Mental State Exam  Orientation to time 5  Orientation to Place 5  Registration 3  Attention/ Calculation 5  Recall 3  Language- name 2 objects 2  Language- repeat 1  Language- follow 3 step command 3  Language- read & follow direction 1  Write a sentence 1  Copy design 1  Total score 30     PHYSICAL EXAM   HEENT:  Normocephalic, atraumatic. The superficial temporal arteries are without ropiness or tenderness. Cardiovascular: Regular rate and rhythm. Lungs: Clear to auscultation bilaterally. Neck: There are no carotid bruits noted bilaterally.  Orientation:  Alert and oriented to person, place and time. No aphasia or dysarthria. Fund of knowledge is appropriate. Recent memory impaired and remote memory intact.  Attention and concentration are normal.  Able to name objects and  repeat phrases. Delayed recall  1/5 Cranial nerves: There is good facial symmetry. Extraocular muscles are intact and visual fields are full to confrontational testing. Speech is fluent and clear. No tongue deviation. Hearing is intact to conversational tone. Tone: Tone is good throughout. Abnormal movements: No tremors. No Asterixis. No Fasciculations Sensation: Sensation is intact to light touch. Vibration is intact at the bilateral big toe.  Coordination: The patient has no difficulty with RAM's or FNF bilaterally. Normal finger to nose  Motor: Strength is 5/5 in the bilateral upper and lower extremities. There is no pronator drift. There are no fasciculations noted. DTR's: Deep tendon reflexes are 2/4 bilaterally. Gait and Station: The patient is able to ambulate without difficulty The patient is able to heel toe  walk. Gait is cautious and narrow. The patient is able to ambulate in a tandem fashion.       Thank you for allowing us  the opportunity to participate in the care of this nice patient. Please do not hesitate to contact us  for any questions or concerns.   Total time spent on today's visit was 57 minutes dedicated to this patient today, preparing to see patient, examining the patient, ordering tests and/or medications and counseling the patient, documenting clinical information in the EHR or other health record, independently interpreting results and communicating results to the patient/family, discussing treatment and goals, answering patient's questions and coordinating care.  Cc:  Daryl Setter, NP  Camie Sevin 11/16/2023 4:52 PM

## 2023-11-16 NOTE — Patient Instructions (Addendum)
 It was a pleasure to see you today at our office.   Recommendations:   MRI of the brain, the radiology office will call you to arrange you appointment  [phone] Check labs today  [address] Follow up in 6  months  Discuss with PCP regarding anxiety medications and psychotherapy   [email or web address]   RECOMMENDATIONS FOR ALL PATIENTS WITH MEMORY PROBLEMS: 1. Continue to exercise (Recommend 30 minutes of walking everyday, or 3 hours every week) 2. Increase social interactions - continue going to [place] and enjoy social gatherings with friends and family 3. Eat healthy, avoid fried foods and eat more fruits and vegetables 4. Maintain adequate blood pressure, blood sugar, and blood cholesterol level. Reducing the risk of stroke and cardiovascular disease also helps promoting better memory. 5. Avoid stressful situations. Live a simple life and avoid aggravations. Organize your time and prepare for the next day in anticipation. 6. Sleep well, avoid any interruptions of sleep and avoid any distractions in the bedroom that may interfere with adequate sleep quality 7. Avoid sugar, avoid sweets as there is a strong link between excessive sugar intake, diabetes, and cognitive impairment We discussed the Mediterranean diet, which has been shown to help patients reduce the risk of progressive memory disorders and reduces cardiovascular risk. This includes eating fish, eat fruits and green leafy vegetables, nuts like almonds and hazelnuts, walnuts, and also use olive oil. Avoid fast foods and fried foods as much as possible. Avoid sweets and sugar as sugar use has been linked to worsening of memory function.  There is always a concern of gradual progression of memory problems. If this is the case, then we may need to adjust level of care according to patient needs. Support, both to the patient and caregiver, should then be put into place.           DRIVING: Regarding driving, in patients with progressive memory problems, driving will be impaired. We advise to have someone else do the driving if trouble finding directions or if minor accidents are reported. Independent driving assessment is available to determine safety of driving.   If you are interested in the driving assessment, you can contact the following:  [facility] [employer] [facility] in [place] [phone]  [facility] [phone]  [facility] [phone]  [facility] [phone] or [phone]   FALL PRECAUTIONS: Be cautious when walking. Scan the area for obstacles that may increase the risk of trips and falls. When getting up in the mornings, sit up at the edge of the bed for a few minutes before getting out of bed. Consider elevating the bed at the head end to avoid drop of blood pressure when getting up. Walk always in a well-lit room (use night lights in the walls). Avoid area rugs or power cords from appliances in the middle of the walkways. Use a walker or a cane if necessary and consider physical therapy for balance exercise. Get your eyesight checked regularly.  FINANCIAL OVERSIGHT: Supervision, especially oversight when making financial decisions or transactions is also recommended.  HOME SAFETY: Consider the safety of the kitchen when operating appliances like stoves, microwave oven, and blender. Consider having supervision and share cooking responsibilities until no longer able to participate in those. Accidents with firearms and other hazards in the house should be identified and addressed as well.   ABILITY TO BE LEFT ALONE: If patient is unable to contact 911 operator, consider using LifeLine, or when the need is there, arrange for someone to stay  with patients. Smoking is a fire hazard, consider supervision or cessation. Risk of wandering should be assessed by caregiver and if detected at any point, supervision and  safe proof recommendations should be instituted.  MEDICATION SUPERVISION: Inability to self-administer medication needs to be constantly addressed. Implement a mechanism to ensure safe administration of the medications.      Mediterranean Diet A Mediterranean diet refers to food and lifestyle choices that are based on the traditions of countries located on the [employer]. This way of eating has been shown to help prevent certain conditions and improve outcomes for people who have chronic diseases, like kidney disease and heart disease. What are tips for following this plan? Lifestyle  Cook and eat meals together with your family, when possible. Drink enough fluid to keep your urine clear or pale yellow. Be physically active every day. This includes: Aerobic exercise like running or swimming. Leisure activities like gardening, walking, or housework. Get 7-8 hours of sleep each night. If recommended by your health care provider, drink red wine in moderation. This means 1 glass a day for nonpregnant women and 2 glasses a day for men. A glass of wine equals 5 oz (150 mL). Reading food labels  Check the serving size of packaged foods. For foods such as rice and pasta, the serving size refers to the amount of cooked product, not dry. Check the total fat in packaged foods. Avoid foods that have saturated fat or trans fats. Check the ingredients list for added sugars, such as corn syrup. Shopping  At the grocery store, buy most of your food from the areas near the walls of the store. This includes: Fresh fruits and vegetables (produce). Grains, beans, nuts, and seeds. Some of these may be available in unpackaged forms or large amounts (in bulk). Fresh seafood. Poultry and eggs. Low-fat dairy products. Buy whole ingredients instead of prepackaged foods. Buy fresh fruits and vegetables in-season from local farmers markets. Buy frozen fruits and vegetables in resealable bags. If you do  not have access to quality fresh seafood, buy precooked frozen shrimp or canned fish, such as tuna, salmon, or sardines. Buy small amounts of raw or cooked vegetables, salads, or olives from the deli or salad bar at your store. Stock your pantry so you always have certain foods on hand, such as olive oil, canned tuna, canned tomatoes, rice, pasta, and beans. Cooking  Cook foods with extra-virgin olive oil instead of using butter or other vegetable oils. Have meat as a side dish, and have vegetables or grains as your main dish. This means having meat in small portions or adding small amounts of meat to foods like pasta or stew. Use beans or vegetables instead of meat in common dishes like chili or lasagna. Experiment with different cooking methods. Try roasting or broiling vegetables instead of steaming or sauteing them. Add frozen vegetables to soups, stews, pasta, or rice. Add nuts or seeds for added healthy fat at each meal. You can add these to yogurt, salads, or vegetable dishes. Marinate fish or vegetables using olive oil, lemon juice, garlic, and fresh herbs. Meal planning  Plan to eat 1 vegetarian meal one day each week. Try to work up to 2 vegetarian meals, if possible. Eat seafood 2 or more times a week. Have healthy snacks readily available, such as: Vegetable sticks with hummus. Greek yogurt. Fruit and nut trail mix. Eat balanced meals throughout the week. This includes: Fruit: 2-3 servings a day Vegetables: 4-5 servings a day Low-fat  dairy: 2 servings a day Fish, poultry, or lean meat: 1 serving a day Beans and legumes: 2 or more servings a week Nuts and seeds: 1-2 servings a day Whole grains: 6-8 servings a day Extra-virgin olive oil: 3-4 servings a day Limit red meat and sweets to only a few servings a month What are my food choices? Mediterranean diet Recommended Grains: Whole-grain pasta. Brown rice. Bulgar wheat. Polenta. Couscous. Whole-wheat bread. [place] Made[place]. Vegetables: Artichokes. Beets. Broccoli. Cabbage. Carrots. Eggplant. Green beans. Chard. Kale. Spinach. Onions. Leeks. Peas. Squash. Tomatoes. Peppers. Radishes. Fruits: Apples. Apricots. Avocado. Berries. Bananas. Cherries. Dates. Figs. Grapes. Lemons. Melon. Oranges. Peaches. Plums. Pomegranate. Meats and other protein foods: Beans. Almonds. Sunflower seeds. Pine nuts. Peanuts. Cod. Salmon. Scallops. Shrimp. Tuna. Tilapia. Clams. Oysters. Eggs. Dairy: Low-fat milk. Cheese. Greek yogurt. Beverages: Water. Red wine. Herbal tea. Fats and oils: Extra virgin olive oil. Avocado oil. Grape seed oil. Sweets and desserts: [place] yogurt with honey. Baked apples. Poached pears. Trail mix. Seasoning and other foods: Basil. Cilantro. Coriander. Cumin. Mint. Parsley. Sage. Rosemary. Tarragon. Garlic. Oregano. Thyme. Pepper. Balsalmic vinegar. Tahini. Hummus. Tomato sauce. Olives. Mushrooms. Limit these Grains: Prepackaged pasta or rice dishes. Prepackaged cereal with added sugar. Vegetables: Deep fried potatoes (french fries). Fruits: Fruit canned in syrup. Meats and other protein foods: Beef. Pork. Lamb. Poultry with skin. Hot dogs. [place]. Dairy: Ice cream. Sour cream. Whole milk. Beverages: Juice. Sugar-sweetened soft drinks. Beer. Liquor and spirits. Fats and oils: Butter. Canola oil. Vegetable oil. Beef fat (tallow). Lard. Sweets and desserts: Cookies. Cakes. Pies. Candy. Seasoning and other foods: Mayonnaise. Premade sauces and marinades. The items listed may not be a complete list. Talk with your dietitian about what dietary choices are right for you. Summary The Mediterranean diet includes both food and lifestyle choices. Eat a variety of fresh fruits and vegetables, beans, nuts, seeds, and whole grains. Limit the amount of red meat and sweets that you eat. Talk with your health care provider about whether it is safe for you to drink red wine in moderation. This means 1 glass a day for  nonpregnant women and 2 glasses a day for men. A glass of wine equals 5 oz (150 mL). This information is not intended to replace advice given to you by your health care provider. Make sure you discuss any questions you have with your health care provider. Document Released: 11/27/2015 Document Revised: 12/30/2015 Document Reviewed: 11/27/2015 Elsevier Interactive Patient Education  2017 [employer].

## 2023-11-17 ENCOUNTER — Ambulatory Visit: Payer: Self-pay | Admitting: Physician Assistant

## 2023-11-23 ENCOUNTER — Encounter: Payer: Self-pay | Admitting: Physician Assistant

## 2023-11-30 ENCOUNTER — Ambulatory Visit
Admission: RE | Admit: 2023-11-30 | Discharge: 2023-11-30 | Disposition: A | Discharge status: Home / Self Care | Source: Ambulatory Visit | Attending: Physician Assistant | Admitting: Physician Assistant

## 2023-11-30 DIAGNOSIS — R413 Other amnesia: Secondary | ICD-10-CM | POA: Diagnosis not present

## 2023-11-30 DIAGNOSIS — R262 Difficulty in walking, not elsewhere classified: Secondary | ICD-10-CM | POA: Diagnosis not present

## 2024-01-23 ENCOUNTER — Ambulatory Visit: Admitting: Physician Assistant

## 2024-01-23 ENCOUNTER — Ambulatory Visit

## 2024-01-30 ENCOUNTER — Other Ambulatory Visit: Payer: Self-pay | Admitting: Medical

## 2024-01-31 ENCOUNTER — Ambulatory Visit: Payer: Self-pay

## 2024-01-31 ENCOUNTER — Institutional Professional Consult (permissible substitution): Admitting: Psychology

## 2024-02-10 ENCOUNTER — Encounter: Admitting: Psychology

## 2024-02-16 ENCOUNTER — Other Ambulatory Visit: Payer: Self-pay | Admitting: Medical

## 2024-03-06 DIAGNOSIS — S42255S Nondisplaced fracture of greater tuberosity of left humerus, sequela: Secondary | ICD-10-CM | POA: Diagnosis not present

## 2024-03-06 DIAGNOSIS — M25512 Pain in left shoulder: Secondary | ICD-10-CM | POA: Diagnosis not present

## 2024-03-06 DIAGNOSIS — M7582 Other shoulder lesions, left shoulder: Secondary | ICD-10-CM | POA: Diagnosis not present

## 2024-03-06 DIAGNOSIS — G8929 Other chronic pain: Secondary | ICD-10-CM | POA: Diagnosis not present

## 2024-03-21 DIAGNOSIS — M25612 Stiffness of left shoulder, not elsewhere classified: Secondary | ICD-10-CM | POA: Diagnosis not present

## 2024-03-21 DIAGNOSIS — M25512 Pain in left shoulder: Secondary | ICD-10-CM | POA: Diagnosis not present

## 2024-03-21 DIAGNOSIS — G8929 Other chronic pain: Secondary | ICD-10-CM | POA: Diagnosis not present

## 2024-03-26 DIAGNOSIS — L570 Actinic keratosis: Secondary | ICD-10-CM | POA: Diagnosis not present

## 2024-03-27 ENCOUNTER — Other Ambulatory Visit: Payer: Self-pay | Admitting: Family

## 2024-03-27 DIAGNOSIS — E785 Hyperlipidemia, unspecified: Secondary | ICD-10-CM

## 2024-03-28 DIAGNOSIS — G8929 Other chronic pain: Secondary | ICD-10-CM | POA: Diagnosis not present

## 2024-03-28 DIAGNOSIS — M25512 Pain in left shoulder: Secondary | ICD-10-CM | POA: Diagnosis not present

## 2024-03-28 DIAGNOSIS — M25612 Stiffness of left shoulder, not elsewhere classified: Secondary | ICD-10-CM | POA: Diagnosis not present

## 2024-05-19 NOTE — Progress Notes (Incomplete)
 "   Dementia of unclear etiology.   Megan Lutz is a very pleasant 74 y.o. RH female with a history of  hyperlipidemia, vitamin D  deficiency, anemia, osteoporosis, anxiety seen today in follow up for memory loss. Patient is currently on***.   Patient was last seen on 10/2023 ***. Memory is ***. MMSE today is  /30. Patient unable to proceed with the neurocognitive testing due to high anxiety. Patient is able to participate on ADLs and continues to drive without difficulties. Mood is*** . This patient is accompanied in the office by her significant other *** who supplements the history.  Previous records as well as any outside records available were reviewed prior to todays visit.   Follow up in  months Recommend good control of cardiovascular risk factors.   Continue to control mood as per PCP   Discussed the use of AI scribe software for clinical note transcription with the patient, who gave verbal consent to proceed.  History of Present Illness    How long did patient have memory difficulties? For the last 1 year, maybe a little longer.  Reports some difficulty with short-term memory not as sharp , especially when remembering new information (boyfriend sometimes tells her the same thing 4 or 5 times and she still will forget), conversations and names. Anxiety triggers or makes memory worse.-she says  Long-term memory is good. repeats oneself?  Endorsed Disoriented when walking into a room?  Patient denies except occasionally not remembering what patient came to the room for   Leaving objects in unusual places? Denies.   Wandering behavior?  denies .  Any personality changes?  She has been more anxious lately likely due to recent family events including funeral, family member undergoing liver transplant. Any history of depression?:  Denies.   Hallucinations or paranoia?  Denies   Seizures?  Denies    Any sleep changes?   Sleeps well. denies vivid dreams, REM behavior or  sleepwalking.   Sleep apnea?  Denies   Any hygiene concerns?  Denies   Independent of bathing and dressing?  Endorsed  Does the patient needs help with medications? Patient is in charge, occasionally she may forget a dose, or sometimes she may overcheck things   Who is in charge of the finances? Patient is in charge.     Any changes in appetite?  Denies.  Does not drink enough water    Patient have trouble swallowing? Denies.   Does the patient cook? Not as much, now and then Any kitchen accidents such as leaving the stove on? Denies.   Any history of headaches?   Denies.   Chronic pain ? Denies.   Ambulates with difficulty?  Denies. R knee arthritis, pops every now and then, may limit mobility.   Recent falls or head injuries? Denies.   Vision changes? Denies.   Any stroke like symptoms? Denies.   Any tremors?   Denies.   Any anosmia?  Denies.   Any incontinence of urine? Denies.   Any bowel dysfunction? Denies.      Grandaughter lives with her, her partner visits her on a regular basis History of heavy alcohol intake? Denies.   History of heavy tobacco use? Denies.   Family history of dementia? Denies.  Does patient drive? Yes, denies any issues        MRI brain 11/16/23 personally reviewed remarkable for mild cerebral atrophy, minimal chronic small vessel disease, no acute findings.         10/15/2019  8:23 AM  MMSE - Mini Mental State Exam  Orientation to time 5  Orientation to Place 5  Registration 3  Attention/ Calculation 5  Recall 3  Language- name 2 objects 2  Language- repeat 1  Language- follow 3 step command 3  Language- read & follow direction 1  Write a sentence 1  Copy design 1  Total score 30      11/16/2023    2:00 PM  Montreal Cognitive Assessment   Visuospatial/ Executive (0/5) 2  Naming (0/3) 3  Attention: Read list of digits (0/2) 2  Attention: Read list of letters (0/1) 1  Attention: Serial 7 subtraction starting at 100 (0/3) 0   Language: Repeat phrase (0/2) 1  Language : Fluency (0/1) 0  Abstraction (0/2) 2  Delayed Recall (0/5) 1  Orientation (0/6) 2  Total 14  Adjusted Score (based on education) 14      Objective:    Neurological Exam:    VITALS:  There were no vitals filed for this visit.  GEN:  The patient appears stated age and is in NAD. HEENT:  Normocephalic, atraumatic.   Neurological examination:  General: NAD, well-groomed, appears stated age. Orientation: The patient is alert. Oriented to person, place and not to date Cranial nerves: There is good facial symmetry.The speech is fluent and clear. No aphasia or dysarthria. Fund of knowledge is appropriate. Recent and remote memory are impaired. Attention and concentration are reduced. Able to name objects and repeat phrases.  Hearing is intact to conversational tone. *** Sensation: Sensation is intact to light touch throughout Motor: Strength is at least antigravity x4. DTR's 2/4 in UE/LE     Movement examination:  Tone: There is normal tone in the UE/LE Abnormal movements:  no tremor.  No myoclonus.  No asterixis.   Coordination:  There is no decremation with RAM's. Normal finger to nose  Gait and Station: The patient has no*** difficulty arising out of a deep-seated chair without the use of the hands. The patient's stride length is good.  Gait is cautious and narrow.    Thank you for allowing us  the opportunity to participate in the care of this nice patient. Please do not hesitate to contact us  for any questions or concerns.   Total time spent on today's visit was *** minutes dedicated to this patient today, preparing to see patient, examining the patient, ordering tests and/or medications and counseling the patient, documenting clinical information in the EHR or other health record, independently interpreting results and communicating results to the patient/family, discussing treatment and goals, answering patient's questions and  coordinating care.  Cc:  Daryl Setter, NP  Camie Sevin 05/19/2024 5:04 PM      "

## 2024-05-21 ENCOUNTER — Ambulatory Visit: Admitting: Physician Assistant

## 2024-06-21 ENCOUNTER — Ambulatory Visit: Admitting: Physician Assistant
